# Patient Record
Sex: Female | Born: 1990 | Race: White | Hispanic: No | Marital: Single | Smoking: Never smoker
Health system: Southern US, Community
[De-identification: ages and names within clinical notes are randomized; demographics above are authoritative.]

## PROBLEM LIST (undated history)

## (undated) DIAGNOSIS — F909 Attention-deficit hyperactivity disorder, unspecified type: Secondary | ICD-10-CM

## (undated) HISTORY — DX: Attention-deficit hyperactivity disorder, unspecified type: F90.9

---

## 2008-08-23 ENCOUNTER — Ambulatory Visit: Payer: Self-pay | Admitting: Family Medicine

## 2008-08-23 DIAGNOSIS — R21 Rash and other nonspecific skin eruption: Secondary | ICD-10-CM | POA: Insufficient documentation

## 2008-08-30 ENCOUNTER — Encounter (INDEPENDENT_AMBULATORY_CARE_PROVIDER_SITE_OTHER): Payer: Self-pay | Admitting: *Deleted

## 2008-09-04 ENCOUNTER — Telehealth (INDEPENDENT_AMBULATORY_CARE_PROVIDER_SITE_OTHER): Payer: Self-pay | Admitting: *Deleted

## 2008-10-04 ENCOUNTER — Telehealth (INDEPENDENT_AMBULATORY_CARE_PROVIDER_SITE_OTHER): Payer: Self-pay | Admitting: *Deleted

## 2008-11-01 ENCOUNTER — Telehealth (INDEPENDENT_AMBULATORY_CARE_PROVIDER_SITE_OTHER): Payer: Self-pay | Admitting: *Deleted

## 2008-12-02 ENCOUNTER — Telehealth (INDEPENDENT_AMBULATORY_CARE_PROVIDER_SITE_OTHER): Payer: Self-pay | Admitting: *Deleted

## 2008-12-04 ENCOUNTER — Ambulatory Visit: Payer: Self-pay | Admitting: Family Medicine

## 2008-12-04 DIAGNOSIS — N390 Urinary tract infection, site not specified: Secondary | ICD-10-CM | POA: Insufficient documentation

## 2008-12-04 LAB — CONVERTED CEMR LAB
Ketones, urine, test strip: NEGATIVE
Nitrite: POSITIVE
Specific Gravity, Urine: 1.025
pH: 6

## 2008-12-05 ENCOUNTER — Encounter: Payer: Self-pay | Admitting: Family Medicine

## 2008-12-05 LAB — CONVERTED CEMR LAB

## 2008-12-10 ENCOUNTER — Encounter (INDEPENDENT_AMBULATORY_CARE_PROVIDER_SITE_OTHER): Payer: Self-pay | Admitting: *Deleted

## 2008-12-31 ENCOUNTER — Telehealth (INDEPENDENT_AMBULATORY_CARE_PROVIDER_SITE_OTHER): Payer: Self-pay | Admitting: *Deleted

## 2009-01-28 ENCOUNTER — Telehealth (INDEPENDENT_AMBULATORY_CARE_PROVIDER_SITE_OTHER): Payer: Self-pay | Admitting: *Deleted

## 2009-02-21 ENCOUNTER — Telehealth (INDEPENDENT_AMBULATORY_CARE_PROVIDER_SITE_OTHER): Payer: Self-pay | Admitting: *Deleted

## 2009-03-24 ENCOUNTER — Telehealth (INDEPENDENT_AMBULATORY_CARE_PROVIDER_SITE_OTHER): Payer: Self-pay | Admitting: *Deleted

## 2009-04-03 ENCOUNTER — Ambulatory Visit: Payer: Self-pay | Admitting: Family Medicine

## 2009-04-03 ENCOUNTER — Other Ambulatory Visit: Admission: RE | Admit: 2009-04-03 | Discharge: 2009-04-03 | Payer: Self-pay | Admitting: Family Medicine

## 2009-04-03 LAB — CONVERTED CEMR LAB
Glucose, Urine, Semiquant: NEGATIVE
Nitrite: NEGATIVE
Protein, U semiquant: NEGATIVE
Specific Gravity, Urine: >=1.03
Urobilinogen, UA: 0.2

## 2009-04-04 LAB — CONVERTED CEMR LAB
AST: 20 units/L (ref 0–37)
Albumin: 3.9 g/dL (ref 3.5–5.2)
Alkaline Phosphatase: 57 units/L (ref 39–117)
Basophils Absolute: 0 10*3/uL (ref 0.0–0.1)
Bilirubin, Direct: 0.1 mg/dL (ref 0.0–0.3)
CO2: 26 meq/L (ref 19–32)
Calcium: 9 mg/dL (ref 8.4–10.5)
Chloride: 106 meq/L (ref 96–112)
Creatinine, Ser: 0.8 mg/dL (ref 0.4–1.2)
Eosinophils Absolute: 0.1 10*3/uL (ref 0.0–0.7)
Glucose, Bld: 102 mg/dL — ABNORMAL HIGH (ref 70–99)
HDL: 49.6 mg/dL (ref >39.00)
Hemoglobin: 14.5 g/dL (ref 12.0–15.0)
Lymphocytes Relative: 21 % (ref 12.0–46.0)
Lymphs Abs: 1.8 10*3/uL (ref 0.7–4.0)
MCHC: 33.2 g/dL (ref 30.0–36.0)
Monocytes Relative: 4.4 % (ref 3.0–12.0)
Neutrophils Relative %: 72.7 % (ref 43.0–77.0)
Platelets: 240 10*3/uL (ref 150.0–400.0)
RBC: 5.05 M/uL (ref 3.87–5.11)
Sodium: 141 meq/L (ref 135–145)
TSH: 3.58 microintl units/mL (ref 0.35–5.50)
Total Protein: 7.8 g/dL (ref 6.0–8.3)
Triglycerides: 71 mg/dL (ref 0.0–149.0)
VLDL: 14.2 mg/dL (ref 0.0–40.0)

## 2009-04-21 ENCOUNTER — Telehealth (INDEPENDENT_AMBULATORY_CARE_PROVIDER_SITE_OTHER): Payer: Self-pay | Admitting: *Deleted

## 2009-05-19 ENCOUNTER — Telehealth (INDEPENDENT_AMBULATORY_CARE_PROVIDER_SITE_OTHER): Payer: Self-pay | Admitting: *Deleted

## 2009-06-09 ENCOUNTER — Emergency Department (HOSPITAL_BASED_OUTPATIENT_CLINIC_OR_DEPARTMENT_OTHER)
Admission: EM | Admit: 2009-06-09 | Discharge: 2009-06-09 | Payer: Self-pay | Source: Home / Self Care | Admitting: Emergency Medicine

## 2009-06-09 ENCOUNTER — Ambulatory Visit: Payer: Self-pay | Admitting: Diagnostic Radiology

## 2009-06-16 ENCOUNTER — Telehealth (INDEPENDENT_AMBULATORY_CARE_PROVIDER_SITE_OTHER): Payer: Self-pay | Admitting: *Deleted

## 2009-07-11 ENCOUNTER — Telehealth (INDEPENDENT_AMBULATORY_CARE_PROVIDER_SITE_OTHER): Payer: Self-pay | Admitting: *Deleted

## 2009-08-06 ENCOUNTER — Telehealth (INDEPENDENT_AMBULATORY_CARE_PROVIDER_SITE_OTHER): Payer: Self-pay | Admitting: *Deleted

## 2009-09-02 ENCOUNTER — Telehealth (INDEPENDENT_AMBULATORY_CARE_PROVIDER_SITE_OTHER): Payer: Self-pay | Admitting: *Deleted

## 2009-09-29 ENCOUNTER — Telehealth (INDEPENDENT_AMBULATORY_CARE_PROVIDER_SITE_OTHER): Payer: Self-pay | Admitting: *Deleted

## 2009-10-29 ENCOUNTER — Ambulatory Visit: Payer: Self-pay | Admitting: Family Medicine

## 2009-10-29 DIAGNOSIS — F988 Other specified behavioral and emotional disorders with onset usually occurring in childhood and adolescence: Secondary | ICD-10-CM | POA: Insufficient documentation

## 2009-11-25 ENCOUNTER — Telehealth (INDEPENDENT_AMBULATORY_CARE_PROVIDER_SITE_OTHER): Payer: Self-pay | Admitting: *Deleted

## 2009-12-22 ENCOUNTER — Telehealth (INDEPENDENT_AMBULATORY_CARE_PROVIDER_SITE_OTHER): Payer: Self-pay | Admitting: *Deleted

## 2010-01-16 ENCOUNTER — Telehealth: Payer: Self-pay | Admitting: Family Medicine

## 2010-02-16 ENCOUNTER — Telehealth (INDEPENDENT_AMBULATORY_CARE_PROVIDER_SITE_OTHER): Payer: Self-pay | Admitting: *Deleted

## 2010-03-16 ENCOUNTER — Telehealth (INDEPENDENT_AMBULATORY_CARE_PROVIDER_SITE_OTHER): Payer: Self-pay | Admitting: *Deleted

## 2010-04-13 ENCOUNTER — Telehealth (INDEPENDENT_AMBULATORY_CARE_PROVIDER_SITE_OTHER): Payer: Self-pay | Admitting: *Deleted

## 2010-05-11 ENCOUNTER — Telehealth (INDEPENDENT_AMBULATORY_CARE_PROVIDER_SITE_OTHER): Payer: Self-pay | Admitting: *Deleted

## 2010-05-26 NOTE — Progress Notes (Signed)
Summary: ritalin refill   Phone Note Refill Request Message from:  Pharmacy  Refills Requested: Medication #1:  RITALIN 10 MG TABS 1 by mouth three times a day patient will pick up tuesday - [id number]  Initial call taken by: Okey Regal Spring,  February 16, 2010 8:53 AM    Prescriptions: RITALIN 10 MG TABS (METHYLPHENIDATE HCL) 1 by mouth three times a day  #90 x 0   Entered by:   Doristine Devoid CMA   Authorized by:   Neena Rhymes MD   Signed by:   Doristine Devoid CMA on 02/16/2010   Method used:   Print then Give to Patient   RxID:   [id number]

## 2010-05-26 NOTE — Progress Notes (Signed)
Summary: ritalin refill   Phone Note Refill Request Call back at Work Phone [phone] Message from:  Patient on August 06, 2009 10:38 AM  Refills Requested: Medication #1:  RITALIN 10 MG TABS 1 by mouth three times a day  Method Requested: Pick up at Office Next Appointment Scheduled: no appt Initial call taken by: Barb Merino,  August 06, 2009 10:39 AM  Follow-up for Phone Call        left message on machine prescription ready for pick up.......Marland KitchenDoristine Devoid  August 06, 2009 2:29 PM     Prescriptions: RITALIN 10 MG TABS (METHYLPHENIDATE HCL) 1 by mouth three times a day  #90 x 0   Entered by:   Doristine Devoid   Authorized by:   Neena Rhymes MD   Signed by:   Doristine Devoid on 08/06/2009   Method used:   Print then Give to Patient   RxID:   [id number]

## 2010-05-26 NOTE — Progress Notes (Signed)
  Phone Note Refill Request Message from:  Patient  Refills Requested: Medication #1:  RITALIN 10 MG TABS 1 by mouth three times a day last ov cpx 04/03/09, last refill #90 x 0 on 04/21/09  Initial call taken by: Kandice Hams,  May 19, 2009 4:01 PM  Follow-up for Phone Call        pt picked up .Kandice Hams  May 19, 2009 4:04 PM  Follow-up by: Kandice Hams,  May 19, 2009 4:04 PM    Prescriptions: RITALIN 10 MG TABS (METHYLPHENIDATE HCL) 1 by mouth three times a day  #90 x 0   Entered by:   Kandice Hams   Authorized by:   Nolon Rod. Paz MD   Signed by:   Kandice Hams on 05/19/2009   Method used:   Print then Give to Patient   RxID:   [id number]

## 2010-05-26 NOTE — Progress Notes (Signed)
Summary: ritalin refill   Phone Note Refill Request Message from:  Patient  Refills Requested: Medication #1:  RITALIN 10 MG TABS 1 by mouth three times a day patient will pick up [id number]  Initial call taken by: Okey Regal Spring,  December 22, 2009 8:51 AM    Prescriptions: RITALIN 10 MG TABS (METHYLPHENIDATE HCL) 1 by mouth three times a day  #90 x 0   Entered by:   Doristine Devoid CMA   Authorized by:   Neena Rhymes MD   Signed by:   Doristine Devoid CMA on 12/22/2009   Method used:   Print then Give to Patient   RxID:   [id number]

## 2010-05-26 NOTE — Progress Notes (Signed)
Summary: ritalin rx  Phone Note Refill Request Call back at Work Phone [phone] Message from:  Patient  Refills Requested: Medication #1:  RITALIN 10 MG TABS 1 by mouth three times a day last ov 04/03/09, last refill #90 x 0 on 05/19/09  Initial call taken by: Kandice Hams,  June 16, 2009 9:28 AM  Follow-up for Phone Call        pt informed rx will be ready today and signed by Dr Drue Novel in the absence of Dr Beverely Low .Kandice Hams  June 16, 2009 9:33 AM  Follow-up by: Kandice Hams,  June 16, 2009 9:33 AM    Prescriptions: RITALIN 10 MG TABS (METHYLPHENIDATE HCL) 1 by mouth three times a day  #90 x 0   Entered by:   Kandice Hams   Authorized by:   Nolon Rod. Paz MD   Signed by:   Kandice Hams on 06/16/2009   Method used:   Print then Give to Patient   RxID:   [id number]

## 2010-05-26 NOTE — Progress Notes (Signed)
Summary: ritalin refill   Phone Note Refill Request Message from:  Patient on November 25, 2009 12:21 PM  Refills Requested: Medication #1:  RITALIN 10 MG TABS 1 by mouth three times a day   Dosage confirmed as above?Dosage Confirmed   Supply Requested: 1 month pt aware rx will be ready within the next 24 hrs.   Method Requested: Pick up at Office Next Appointment Scheduled: none Initial call taken by: Lavell Islam,  November 25, 2009 12:21 PM    Prescriptions: RITALIN 10 MG TABS (METHYLPHENIDATE HCL) 1 by mouth three times a day  #90 x 0   Entered by:   Doristine Devoid CMA   Authorized by:   Neena Rhymes MD   Signed by:   Doristine Devoid CMA on 11/26/2009   Method used:   Print then Give to Patient   RxID:   [id number]

## 2010-05-26 NOTE — Progress Notes (Signed)
Summary: ritalin refill   Phone Note Refill Request Call back at Work Phone [phone] Message from:  Patient on September 29, 2009 10:32 AM  Refills Requested: Medication #1:  RITALIN 10 MG TABS 1 by mouth three times a day call when ready   Method Requested: Pick up at Office Initial call taken by: Okey Regal Spring,  September 29, 2009 10:33 AM  Follow-up for Phone Call        needs office visit before next refill .......Marland KitchenDoristine Devoid  September 29, 2009 1:38 PM     Prescriptions: RITALIN 10 MG TABS (METHYLPHENIDATE HCL) 1 by mouth three times a day  #90 x 0   Entered by:   Doristine Devoid   Authorized by:   Neena Rhymes MD   Signed by:   Doristine Devoid on 09/29/2009   Method used:   Print then Give to Patient   RxID:   [id number]   Appended Document: ritalin refill  patient aware prescription ready for pick up

## 2010-05-26 NOTE — Progress Notes (Signed)
Summary: refill   Phone Note Refill Request Call back at Work Phone [phone] Message from:  Patient on Sep 02, 2009 8:28 AM  Refills Requested: Medication #1:  RITALIN 10 MG TABS 1 by mouth three times a day call when ready   Method Requested: Pick up at Office Next Appointment Scheduled: none Initial call taken by: Okey Regal Spring,  Sep 02, 2009 8:28 AM  Follow-up for Phone Call        pt picked up med yesterday per [facility] .Kandice Hams  Sep 03, 2009 11:00 AM  Follow-up by: Kandice Hams,  Sep 03, 2009 11:00 AM

## 2010-05-26 NOTE — Progress Notes (Signed)
Summary: ritalin rx  Phone Note Refill Request Call back at Work Phone [phone] Message from:  Patient on July 11, 2009 10:08 AM  Refills Requested: Medication #1:  RITALIN 10 MG TABS 1 by mouth three times a day call when ready   Initial call taken by: Doristine Devoid,  July 11, 2009 10:09 AM  Follow-up for Phone Call        last office visit 12/10 cpx last filled 06/16/09.Marland KitchenDoristine Devoid  July 11, 2009 10:10 AM   left msg prescription ready for pick up..........Marland KitchenDoristine Devoid  July 11, 2009 10:11 AM     Prescriptions: RITALIN 10 MG TABS (METHYLPHENIDATE HCL) 1 by mouth three times a day  #90 x 0   Entered by:   Doristine Devoid   Authorized by:   Loreen Freud DO   Signed by:   Doristine Devoid on 07/11/2009   Method used:   Print then Give to Patient   RxID:   [id number]

## 2010-05-26 NOTE — Assessment & Plan Note (Signed)
Summary: med check /cbs   Vital Signs:  Patient profile:   20 year old female Height:      66.50 inches Weight:      214 pounds BMI:     34.15 Pulse rate:   70 / minute BP sitting:   120 / 80  (left arm)  Vitals Entered By: Doristine Devoid (October 29, 2009 1:59 PM) CC: f/u up on ritalin   History of Present Illness: 20 yo girl here today for f/u on Ritalin.  current dose is working- 10mg  three times a day.  working at [employer] this summer, not taking summer classes.  no palpitations, anorexia, insomnia.  needs refill on OCP.  Current Medications (verified): 1)  Ritalin 10 Mg Tabs (Methylphenidate Hcl) .Marland Kitchen.. 1 By Mouth Three Times A Day 2)  Sprintec 28 0.25-35 Mg-Mcg Tabs (Norgestimate-Eth Estradiol) .Marland Kitchen.. 1 Pill Daily As Directed.  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 Lungs are clear to auscultation, no crackles or wheezes. Heart:  normal rate, regular rhythm, and no murmur.   Psych:  Cognition and judgment appear intact. Alert and cooperative with normal attention span and concentration. No apparent delusions, illusions, hallucinations   Impression & Recommendations:  Problem # 1:  ADD (ICD-314.00) Assessment New no side effects from meds.  sxs well controlled.  continue current dose.  Problem # 2:  CONTRACEPTIVE MANAGEMENT (ICD-V25.09) Assessment: Unchanged refills provided.  Complete Medication List: 1)  Ritalin 10 Mg Tabs (Methylphenidate hcl) .Marland Kitchen.. 1 by mouth three times a day 2)  Sprintec 28 0.25-35 Mg-mcg Tabs  (Norgestimate-eth estradiol) .Marland Kitchen.. 1 pill daily as directed.  Patient Instructions: 1)  Follow up this winter for your physical 2)  Call for your Ritalin monthly 3)  Call with any questions or concerns 4)  Have a great summer!! Prescriptions: RITALIN 10 MG TABS (METHYLPHENIDATE HCL) 1 by mouth three times a day  #90 x 0   Entered and Authorized by:   Neena Rhymes MD   Signed by:   Neena Rhymes MD on 10/29/2009   Method used:   Print then Give to Patient   RxID:   [id number] SPRINTEC 28 0.25-35 MG-MCG TABS (NORGESTIMATE-ETH ESTRADIOL) 1 pill daily as directed.  #1 x 11   Entered and Authorized by:   Neena Rhymes MD   Signed by:   Neena Rhymes MD on 10/29/2009   Method used:   Electronically to        [facility] [address] (retail)       [phone]       [place], [place]  [address]       Ph: [phone]       Fax: [phone]   RxID:   [id number]

## 2010-05-26 NOTE — Progress Notes (Signed)
Summary: Ritalin refill  Phone Note Refill Request Message from:  Patient on March 16, 2010 2:33 PM  Refills Requested: Medication #1:  RITALIN 10 MG TABS 1 by mouth three times a day Patient needs Ritalin prescription refilled.  Advised patient that it will be ready in 24 hours for pickup unless someone calls them  Initial call taken by: Jerolyn Shin,  March 16, 2010 2:33 PM    Prescriptions: RITALIN 10 MG TABS (METHYLPHENIDATE HCL) 1 by mouth three times a day  #90 x 0   Entered by:   Doristine Devoid CMA   Authorized by:   Neena Rhymes MD   Signed by:   Doristine Devoid CMA on 03/16/2010   Method used:   Print then Give to Patient   RxID:   [id number]

## 2010-05-26 NOTE — Progress Notes (Signed)
Summary: Refill--Ritalin  Phone Note Call from Patient Call back at [phone]   Caller: Patient Summary of Call: REFILL ON RITALIN 10 MG Initial call taken by: Freddy Jaksch,  January 16, 2010 12:56 PM  Follow-up for Phone Call        left message on voicemail to call back to office. Alisse Tuite CMA  January 19, 2010 9:42 AM   Spoke with patient and she will not have enough to last until MD returns, she is aware that a partner MD will sign and she can pick up prescription tomorrow. Jeani Fassnacht CMA  January 19, 2010 9:48 AM     Prescriptions: RITALIN 10 MG TABS (METHYLPHENIDATE HCL) 1 by mouth three times a day  #90 x 0   Entered by:   Lucious Groves CMA   Authorized by:   Loreen Freud DO   Signed by:   Lucious Groves CMA on 01/19/2010   Method used:   Print then Give to Patient   RxID:   [id number]

## 2010-05-28 NOTE — Progress Notes (Signed)
Summary: RX  Phone Note Refill Request Call back at [phone] Message from:  Patient on May 11, 2010 9:36 AM  Refills Requested: Medication #1:  RITALIN 10 MG TABS 1 by mouth three times a day   Dosage confirmed as above?Dosage Confirmed   Supply Requested: 1 month Initial call taken by: Freddy Jaksch,  May 11, 2010 9:36 AM  Follow-up for Phone Call        patient aware prescription sent to pharmacy .....Marland KitchenMarland KitchenDoristine Devoid CMA  May 11, 2010 2:21 PM     Prescriptions: RITALIN 10 MG TABS (METHYLPHENIDATE HCL) 1 by mouth three times a day  #90 x 0   Entered by:   Doristine Devoid CMA   Authorized by:   Neena Rhymes MD   Signed by:   Doristine Devoid CMA on 05/11/2010   Method used:   Print then Give to Patient   RxID:   [id number]

## 2010-05-28 NOTE — Progress Notes (Signed)
Summary: REFILL RITALIN 10MG   Phone Note Refill Request Call back at Work Phone [phone] Message from:  Patient on April 13, 2010 8:58 AM  Refills Requested: Medication #1:  RITALIN 10 MG TABS 1 by mouth three times a day   Dosage confirmed as above?Dosage Confirmed   Supply Requested: 1 month   Last Refilled: 03/16/2010  Method Requested: Pick up at Office Next Appointment Scheduled: NONE Initial call taken by: Magdalen Spatz [facility],  April 13, 2010 8:58 AM  Follow-up for Phone Call        left message on machine prescription ready for pick up........Marland KitchenDoristine Devoid CMA  April 13, 2010 11:24 AM     Prescriptions: RITALIN 10 MG TABS (METHYLPHENIDATE HCL) 1 by mouth three times a day  #90 x 0   Entered by:   Doristine Devoid CMA   Authorized by:   Neena Rhymes MD   Signed by:   Doristine Devoid CMA on 04/13/2010   Method used:   Print then Give to Patient   RxID:   [id number]

## 2010-06-05 ENCOUNTER — Telehealth (INDEPENDENT_AMBULATORY_CARE_PROVIDER_SITE_OTHER): Payer: Self-pay | Admitting: *Deleted

## 2010-06-11 NOTE — Progress Notes (Signed)
Summary: ritalin refill  Phone Note Refill Request Call back at Work Phone [phone] Message from:  Patient on June 05, 2010 9:09 AM  Refills Requested: Medication #1:  RITALIN 10 MG TABS 1 by mouth three times a day Patient needs Ritalin prescription refilled.  Advised patient that it will be ready in 24 hours for pickup unless someone calls them  Next Appointment Scheduled: none Initial call taken by: Jerolyn Shin,  June 05, 2010 9:09 AM    Prescriptions: RITALIN 10 MG TABS (METHYLPHENIDATE HCL) 1 by mouth three times a day  #90 x 0   Entered by:   Doristine Devoid CMA   Authorized by:   Neena Rhymes MD   Signed by:   Doristine Devoid CMA on 06/05/2010   Method used:   Print then Give to Patient   RxID:   [id number]

## 2010-07-01 ENCOUNTER — Telehealth: Payer: Self-pay | Admitting: Family Medicine

## 2010-07-07 NOTE — Progress Notes (Signed)
Summary: Refill--Ritalin  Phone Note Refill Request Message from:  Patient on July 01, 2010 8:30 AM  Refills Requested: Medication #1:  RITALIN 10 MG TABS 1 by mouth three times a day   Last Refilled: 06/05/2010 patient will pick up thursday [id number]  Next Appointment Scheduled: none Initial call taken by: Okey Regal Spring,  July 01, 2010 8:30 AM  Follow-up for Phone Call        Last office visit 7/11. Please advise. Richetta Cubillos CMA  July 01, 2010 8:44 AM   Additional Follow-up for Phone Call Additional follow up Details #1::        ok to refill- needs to schedule CPE Additional Follow-up by: Neena Rhymes MD,  July 01, 2010 9:41 AM    Additional Follow-up for Phone Call Additional follow up Details #2::    Wil make notation to schedule CPE when she picks up RX. Yareth Kearse CMA  July 01, 2010 9:47 AM   Prescriptions: RITALIN 10 MG TABS (METHYLPHENIDATE HCL) 1 by mouth three times a day  #90 x 0   Entered by:   Lucious Groves CMA   Authorized by:   Neena Rhymes MD   Signed by:   Lucious Groves CMA on 07/01/2010   Method used:   Print then Give to Patient   RxID:   [id number]

## 2010-07-29 ENCOUNTER — Telehealth: Payer: Self-pay | Admitting: Family Medicine

## 2010-07-29 MED ORDER — METHYLPHENIDATE HCL 10 MG PO TABS: 10.0000 mg | ORAL_TABLET | Freq: Three times a day (TID) | ORAL | Status: DC

## 2010-07-29 NOTE — Telephone Encounter (Signed)
Ok to refill but please let her know that this is the last refill until she has a physical scheduled.

## 2010-07-29 NOTE — Telephone Encounter (Signed)
Pt aware Rx ready for pick up 

## 2010-07-29 NOTE — Telephone Encounter (Signed)
Pt has not yet sch CPE as asked in March. Please advise.

## 2010-07-29 NOTE — Telephone Encounter (Signed)
Patient called to request Ritalin refill

## 2010-08-24 ENCOUNTER — Telehealth: Payer: Self-pay | Admitting: Family Medicine

## 2010-08-24 MED ORDER — METHYLPHENIDATE HCL 10 MG PO TABS: 10.0000 mg | ORAL_TABLET | Freq: Three times a day (TID) | ORAL | Status: DC

## 2010-08-24 NOTE — Telephone Encounter (Signed)
Ok to provide

## 2010-08-24 NOTE — Telephone Encounter (Signed)
Ok for refill since i'm the one changing the appt

## 2010-08-24 NOTE — Telephone Encounter (Signed)
Pt.notified

## 2010-08-24 NOTE — Telephone Encounter (Signed)
Patient was scheduled for cpx [id number] appt was rescheduled (md out of office) it was rescheduled for [id number] - she will be out of ritalin - patient wants refill until appt

## 2010-09-01 ENCOUNTER — Encounter: Payer: Self-pay | Admitting: Family Medicine

## 2010-09-07 ENCOUNTER — Encounter: Payer: Self-pay | Admitting: Family Medicine

## 2010-09-07 ENCOUNTER — Ambulatory Visit (INDEPENDENT_AMBULATORY_CARE_PROVIDER_SITE_OTHER): Payer: BC Managed Care – PPO | Admitting: Family Medicine

## 2010-09-07 DIAGNOSIS — Z0000 Encounter for general adult medical examination without abnormal findings: Secondary | ICD-10-CM

## 2010-09-07 DIAGNOSIS — F988 Other specified behavioral and emotional disorders with onset usually occurring in childhood and adolescence: Secondary | ICD-10-CM

## 2010-09-07 MED ORDER — METHYLPHENIDATE HCL ER (OSM) 27 MG PO TBCR: 27.0000 mg | EXTENDED_RELEASE_TABLET | Freq: Every day | ORAL | Status: DC

## 2010-09-07 NOTE — Patient Instructions (Signed)
Schedule your pap at your convenience- if you don't eat before this appt we can get your labs done Start the Concerta and let me know at your pap how it's working for you Call with any questions or concerns Enjoy Summer!!!

## 2010-09-07 NOTE — Progress Notes (Signed)
  Subjective:    Patient ID: Anabel Bene, female    DOB: [birth year], 20 y.o.   MRN: [id number]  HPI CPE- having period currently, would like to defer pap.  ADD- would like alternative to Ritalin b/c she's 'no longer feeling it'.  No side effects.  Has never been on long acting meds.   Review of Systems Patient reports no vision/ hearing changes, adenopathy,fever, weight change,  persistant/recurrent hoarseness , swallowing issues, chest pain, palpitations, edema, persistant/recurrent cough, hemoptysis, dyspnea (rest/exertional/paroxysmal nocturnal), gastrointestinal bleeding (melena, rectal bleeding), abdominal pain, significant heartburn, bowel changes, GU symptoms (dysuria, hematuria, incontinence), Gyn symptoms (abnormal  bleeding, pain),  syncope, focal weakness, memory loss, numbness & tingling, skin/hair/nail changes, abnormal bruising or bleeding, anxiety, or depression.     Objective:   Physical Exam  General Appearance:    Alert, cooperative, no distress, appears stated age, overwt  Head:    Normocephalic, without obvious abnormality, atraumatic  Eyes:    PERRL, conjunctiva/corneas clear, EOM's intact, fundi    benign, both eyes  Ears:    Normal TM's and external ear canals, both ears  Nose:   Nares normal, septum midline, mucosa normal, no drainage    or sinus tenderness  Throat:   Lips, mucosa, and tongue normal; teeth and gums normal  Neck:   Supple, symmetrical, trachea midline, no adenopathy;    Thyroid: no enlargement/tenderness/nodules  Back:     Symmetric, no curvature, ROM normal, no CVA tenderness  Lungs:     Clear to auscultation bilaterally, respirations unlabored  Chest Wall:    No tenderness or deformity   Heart:    Regular rate and rhythm, S1 and S2 normal, no murmur, rub   or gallop  Breast Exam:    No tenderness, masses, or nipple abnormality  Abdomen:     Soft, non-tender, bowel sounds active all four quadrants,    no masses, no organomegaly  Genitalia:     Deferred due to menses  Rectal:    Extremities:   Extremities normal, atraumatic, no cyanosis or edema  Pulses:   2+ and symmetric all extremities  Skin:   Skin color, texture, turgor normal, no rashes or lesions  Lymph nodes:   Cervical, supraclavicular, and axillary nodes normal  Neurologic:   CNII-XII intact, normal strength, sensation and reflexes    throughout          Assessment & Plan:

## 2010-09-14 ENCOUNTER — Encounter: Payer: Self-pay | Admitting: Family Medicine

## 2010-09-15 DIAGNOSIS — Z0000 Encounter for general adult medical examination without abnormal findings: Secondary | ICD-10-CM | POA: Insufficient documentation

## 2010-09-15 NOTE — Assessment & Plan Note (Signed)
Stop pt's short acting meds and start long acting concerta at slightly higher dose.  Reviewed supportive care and red flags that should prompt return.  Pt expressed understanding and is in agreement w/ plan.

## 2010-09-15 NOTE — Assessment & Plan Note (Signed)
Pt's PE WNL w/ exception of weight.  Stressed the importance of healthy food choices and regular exercise.  Pt aware and plans to make this a priority this summer.  Will follow.

## 2010-10-08 ENCOUNTER — Ambulatory Visit (INDEPENDENT_AMBULATORY_CARE_PROVIDER_SITE_OTHER): Payer: BC Managed Care – PPO | Admitting: Family Medicine

## 2010-10-08 ENCOUNTER — Other Ambulatory Visit (HOSPITAL_COMMUNITY)
Admission: RE | Admit: 2010-10-08 | Discharge: 2010-10-08 | Disposition: A | Discharge status: Home / Self Care | Payer: BC Managed Care – PPO | Source: Ambulatory Visit | Attending: Family Medicine | Admitting: Family Medicine

## 2010-10-08 DIAGNOSIS — J329 Chronic sinusitis, unspecified: Secondary | ICD-10-CM

## 2010-10-08 DIAGNOSIS — Z124 Encounter for screening for malignant neoplasm of cervix: Secondary | ICD-10-CM

## 2010-10-08 DIAGNOSIS — F988 Other specified behavioral and emotional disorders with onset usually occurring in childhood and adolescence: Secondary | ICD-10-CM

## 2010-10-08 DIAGNOSIS — Z01419 Encounter for gynecological examination (general) (routine) without abnormal findings: Secondary | ICD-10-CM | POA: Insufficient documentation

## 2010-10-08 DIAGNOSIS — Z0000 Encounter for general adult medical examination without abnormal findings: Secondary | ICD-10-CM

## 2010-10-08 MED ORDER — CLARITHROMYCIN ER 500 MG PO TB24: 1000.0000 mg | ORAL_TABLET | Freq: Every day | ORAL | Status: AC

## 2010-10-08 MED ORDER — NORGESTIMATE-ETH ESTRADIOL 0.25-35 MG-MCG PO TABS: 1.0000 | ORAL_TABLET | Freq: Every day | ORAL | Status: DC

## 2010-10-08 MED ORDER — METHYLPHENIDATE HCL 10 MG PO TABS: 10.0000 mg | ORAL_TABLET | Freq: Three times a day (TID) | ORAL | Status: DC

## 2010-10-08 NOTE — Progress Notes (Signed)
  Subjective:    Patient ID: Crystal Clements, female    DOB: August 18, 1990, 20 y.o.   MRN: [id number]  HPI Here today for pap.  No vaginal concerns.  ADD- doesn't like concerta.  Wants to switch back to Ritalin.  Doesn't like the idea of 1 pill lasting 24 hrs, 'i want to have more control'.  Didn't feel any effects from med.  Congestion- sxs started 2 days ago.  Has had nasal 'burning'.  No fevers.  + cough- 'it's in my chest'.  Has been taking mucinex and decongestant w/ some relief but cough is worsening.  + facial pain/pressure, tooth pain.    Review of Systems For ROS see HPI     Objective:   Physical Exam  Constitutional: She appears well-developed and well-nourished. No distress.  HENT:  Head: Normocephalic and atraumatic.  Right Ear: Tympanic membrane normal.  Left Ear: Tympanic membrane normal.  Nose: Mucosal edema and rhinorrhea present. Right sinus exhibits maxillary sinus tenderness and frontal sinus tenderness. Left sinus exhibits maxillary sinus tenderness and frontal sinus tenderness.  Mouth/Throat: Uvula is midline and mucous membranes are normal. Posterior oropharyngeal erythema present. No oropharyngeal exudate.  Eyes: Conjunctivae and EOM are normal. Pupils are equal, round, and reactive to light.  Neck: Normal range of motion. Neck supple.  Cardiovascular: Normal rate, regular rhythm and normal heart sounds.   Pulmonary/Chest: Effort normal and breath sounds normal. No respiratory distress. She has no wheezes.  Genitourinary: Uterus normal. Rectal exam shows no external hemorrhoid. There is no rash, tenderness or lesion on the right labia. There is no rash, tenderness or lesion on the left labia. Uterus is not deviated, not enlarged and not tender. Cervix exhibits no motion tenderness, no discharge and no friability. Right adnexum displays no mass, no tenderness and no fullness. Left adnexum displays no mass, no tenderness and no fullness. No tenderness or bleeding around  the vagina. No vaginal discharge found.  Lymphadenopathy:    She has no cervical adenopathy.          Assessment & Plan:

## 2010-10-08 NOTE — Patient Instructions (Signed)
You have a sinus infection Take the biaxin as directed- take w/ food to avoid upset stomach We'll notify you of your lab results and pap Continue your mucinex Drink plenty of fluids Call with any questions or concerns Hang in there!

## 2010-10-09 ENCOUNTER — Encounter: Payer: Self-pay | Admitting: *Deleted

## 2010-10-09 LAB — LIPID PANEL
Cholesterol: 188 mg/dL (ref 0–200)
LDL Cholesterol: 116 mg/dL — ABNORMAL HIGH (ref 0–99)

## 2010-10-09 LAB — CBC WITH DIFFERENTIAL/PLATELET
Basophils Absolute: 0 10*3/uL (ref 0.0–0.1)
HCT: 39.9 % (ref 36.0–46.0)
Lymphocytes Relative: 36.2 % (ref 12.0–46.0)
MCHC: 33.6 g/dL (ref 30.0–36.0)
Neutrophils Relative %: 51 % (ref 43.0–77.0)
Platelets: 179 10*3/uL (ref 150.0–400.0)
RDW: 14.4 % (ref 11.5–14.6)

## 2010-10-09 LAB — BASIC METABOLIC PANEL
CO2: 24 mEq/L (ref 19–32)
Chloride: 109 mEq/L (ref 96–112)
GFR: 123.75 mL/min (ref >60.00)
Glucose, Bld: 84 mg/dL (ref 70–99)
Potassium: 4.2 mEq/L (ref 3.5–5.1)
Sodium: 140 mEq/L (ref 135–145)

## 2010-10-09 LAB — HEPATIC FUNCTION PANEL
AST: 18 U/L (ref 0–37)
Albumin: 3.7 g/dL (ref 3.5–5.2)
Alkaline Phosphatase: 80 U/L (ref 39–117)
Total Protein: 7.1 g/dL (ref 6.0–8.3)

## 2010-10-09 LAB — TSH: TSH: 1.72 u[IU]/mL (ref 0.35–5.50)

## 2010-10-11 LAB — VITAMIN D 1,25 DIHYDROXY
Vitamin D2 1, 25 (OH)2: <8 pg/mL
Vitamin D3 1, 25 (OH)2: 59 pg/mL

## 2010-10-12 ENCOUNTER — Encounter: Payer: Self-pay | Admitting: *Deleted

## 2010-10-12 ENCOUNTER — Encounter: Payer: Self-pay | Admitting: Family Medicine

## 2010-10-12 DIAGNOSIS — J329 Chronic sinusitis, unspecified: Secondary | ICD-10-CM | POA: Insufficient documentation

## 2010-10-12 DIAGNOSIS — Z124 Encounter for screening for malignant neoplasm of cervix: Secondary | ICD-10-CM | POA: Insufficient documentation

## 2010-10-12 NOTE — Assessment & Plan Note (Signed)
Pt's hx and PE consistent w/ infxn.  Start abx.  Reviewed supportive care and red flags that should prompt return.  Pt expressed understanding and is in agreement w/ plan.  

## 2010-10-12 NOTE — Assessment & Plan Note (Signed)
Switch back to short acting meds so pt feels that she has more control over her tx.

## 2010-10-12 NOTE — Assessment & Plan Note (Signed)
Check labs 

## 2010-10-12 NOTE — Assessment & Plan Note (Signed)
Pap collected. 

## 2010-10-14 ENCOUNTER — Encounter: Payer: Self-pay | Admitting: *Deleted

## 2010-11-09 ENCOUNTER — Other Ambulatory Visit: Payer: Self-pay | Admitting: Family Medicine

## 2010-11-09 MED ORDER — METHYLPHENIDATE HCL 10 MG PO TABS: 10.0000 mg | ORAL_TABLET | Freq: Three times a day (TID) | ORAL | Status: DC

## 2010-11-09 NOTE — Telephone Encounter (Signed)
Refill ritalin - patient will pick up Tuesday [id number]

## 2010-11-09 NOTE — Telephone Encounter (Signed)
Printed, will have Lowne sign in MD absence.

## 2010-12-10 ENCOUNTER — Other Ambulatory Visit: Payer: Self-pay | Admitting: Family Medicine

## 2010-12-10 MED ORDER — METHYLPHENIDATE HCL 10 MG PO TABS: 10.0000 mg | ORAL_TABLET | Freq: Three times a day (TID) | ORAL | Status: DC

## 2010-12-10 NOTE — Telephone Encounter (Signed)
Ok for 1 month refill

## 2010-12-10 NOTE — Telephone Encounter (Signed)
Last filled 11/09/10

## 2010-12-10 NOTE — Telephone Encounter (Signed)
Rx ready for pt to pick up. Pt aware

## 2011-01-11 ENCOUNTER — Other Ambulatory Visit: Payer: Self-pay | Admitting: Family Medicine

## 2011-01-11 MED ORDER — METHYLPHENIDATE HCL 10 MG PO TABS: 10.0000 mg | ORAL_TABLET | Freq: Three times a day (TID) | ORAL | Status: DC

## 2011-01-11 NOTE — Telephone Encounter (Signed)
Ok for refill? 

## 2011-01-11 NOTE — Telephone Encounter (Signed)
Done

## 2011-01-11 NOTE — Telephone Encounter (Signed)
Last OV 10/08/10. Last filled 12/10/10

## 2011-02-08 ENCOUNTER — Other Ambulatory Visit: Payer: Self-pay | Admitting: Family Medicine

## 2011-02-08 MED ORDER — METHYLPHENIDATE HCL 10 MG PO TABS: 10.0000 mg | ORAL_TABLET | Freq: Three times a day (TID) | ORAL | Status: DC

## 2011-02-08 NOTE — Telephone Encounter (Signed)
Refill ritalin - patient will pick up [id number] tuesday

## 2011-03-05 ENCOUNTER — Telehealth: Payer: Self-pay | Admitting: Family Medicine

## 2011-03-05 NOTE — Telephone Encounter (Signed)
Left message to call office Immunization printed and ready for pick-up. Placed up front.

## 2011-03-11 ENCOUNTER — Telehealth: Payer: Self-pay | Admitting: Family Medicine

## 2011-03-11 NOTE — Telephone Encounter (Signed)
Refill ritalin - patient will pick up Monday [id number]

## 2011-03-12 MED ORDER — METHYLPHENIDATE HCL 10 MG PO TABS: 10.0000 mg | ORAL_TABLET | Freq: Three times a day (TID) | ORAL | Status: DC

## 2011-03-12 NOTE — Telephone Encounter (Signed)
Placed ritalin rx for pt at front desk. .left message to have patient return my call. With mom.

## 2011-03-12 NOTE — Telephone Encounter (Signed)
Ok for refill? 

## 2011-03-12 NOTE — Telephone Encounter (Signed)
Last OV 10-08-10 last refill 02-03-11

## 2011-03-15 ENCOUNTER — Other Ambulatory Visit: Payer: Self-pay | Admitting: *Deleted

## 2011-03-15 NOTE — Telephone Encounter (Signed)
Spoke to pt to advise rx is up front desk for pick up.pt understood

## 2011-03-24 ENCOUNTER — Ambulatory Visit (INDEPENDENT_AMBULATORY_CARE_PROVIDER_SITE_OTHER): Payer: BC Managed Care – PPO | Admitting: *Deleted

## 2011-03-24 DIAGNOSIS — Z111 Encounter for screening for respiratory tuberculosis: Secondary | ICD-10-CM

## 2011-03-24 DIAGNOSIS — Z0000 Encounter for general adult medical examination without abnormal findings: Secondary | ICD-10-CM

## 2011-03-25 LAB — VARICELLA ZOSTER ANTIBODY, IGG: Varicella IgG: 4.16 {ISR} — ABNORMAL HIGH

## 2011-03-26 ENCOUNTER — Encounter: Payer: Self-pay | Admitting: *Deleted

## 2011-03-26 LAB — TB SKIN TEST
Induration: 0
TB Skin Test: NEGATIVE mm

## 2011-04-13 ENCOUNTER — Telehealth: Payer: Self-pay | Admitting: Family Medicine

## 2011-04-13 NOTE — Telephone Encounter (Signed)
Last OV 10-08-10 last refill 03-12-11 #90 no refills

## 2011-04-13 NOTE — Telephone Encounter (Signed)
Refill ritalin - dad will pick up [id number]

## 2011-04-14 MED ORDER — METHYLPHENIDATE HCL 10 MG PO TABS: 10.0000 mg | ORAL_TABLET | Freq: Three times a day (TID) | ORAL | Status: DC

## 2011-04-14 NOTE — Telephone Encounter (Signed)
Rx placed up front for pick up, pt aware

## 2011-04-14 NOTE — Telephone Encounter (Signed)
Ok for #90, no refills 

## 2011-05-13 ENCOUNTER — Telehealth: Payer: Self-pay | Admitting: Family Medicine

## 2011-05-13 MED ORDER — METHYLPHENIDATE HCL 10 MG PO TABS: 10.0000 mg | ORAL_TABLET | Freq: Three times a day (TID) | ORAL | Status: DC

## 2011-05-13 NOTE — Telephone Encounter (Signed)
Called pt to advise the medication rx is at front desk for pick up

## 2011-05-13 NOTE — Telephone Encounter (Signed)
Patient is requesting a new prescription for ritalin.

## 2011-05-13 NOTE — Telephone Encounter (Signed)
Ok for refill? 

## 2011-05-13 NOTE — Telephone Encounter (Signed)
Last OV 10-08-11 last refill 04-14-11 #90 no refills per TID

## 2011-06-11 ENCOUNTER — Other Ambulatory Visit: Payer: Self-pay | Admitting: *Deleted

## 2011-06-11 NOTE — Telephone Encounter (Signed)
Pt called in to ask for rx Ritalin, noted last OV 10-08-10 last refill 05-13-11 #90 no refills

## 2011-06-13 NOTE — Telephone Encounter (Signed)
Ok for #90, no refill 

## 2011-06-14 MED ORDER — METHYLPHENIDATE HCL 10 MG PO TABS: 10.0000 mg | ORAL_TABLET | Freq: Three times a day (TID) | ORAL | Status: DC

## 2011-06-14 NOTE — Telephone Encounter (Signed)
Placed signed rx for ritalin at front desk for pt pick up, pt aware

## 2011-06-14 NOTE — Telephone Encounter (Signed)
Addended by: Derry Lory A on: 06/14/2011 08:20 AM   Modules accepted: Orders

## 2011-07-09 ENCOUNTER — Other Ambulatory Visit: Payer: Self-pay | Admitting: *Deleted

## 2011-07-09 MED ORDER — METHYLPHENIDATE HCL 10 MG PO TABS: 10.0000 mg | ORAL_TABLET | Freq: Three times a day (TID) | ORAL | Status: DC

## 2011-07-09 NOTE — Telephone Encounter (Signed)
Pt called to request rx for ritalin, noted pt last seen for GME on 03-24-11, MD Beverely Low verified the approval verbally to refill medication per last refill noted 05-13-11 for #90 no refill, pt is aware that the signed rx has been placed at the front desk for pick up.

## 2011-08-09 ENCOUNTER — Other Ambulatory Visit: Payer: Self-pay | Admitting: *Deleted

## 2011-08-09 MED ORDER — METHYLPHENIDATE HCL 10 MG PO TABS: 10.0000 mg | ORAL_TABLET | Freq: Three times a day (TID) | ORAL | Status: DC

## 2011-08-09 NOTE — Telephone Encounter (Signed)
Pt aware Rx ready for pick up, and 6 month f/u due.

## 2011-09-28 ENCOUNTER — Telehealth: Payer: Self-pay | Admitting: *Deleted

## 2011-09-28 MED ORDER — METHYLPHENIDATE HCL 10 MG PO TABS: 10.0000 mg | ORAL_TABLET | Freq: Three times a day (TID) | ORAL | Status: DC

## 2011-09-28 NOTE — Telephone Encounter (Signed)
Pt left vm stating she needs refill on her ritalin, called pt and she verified that she is doing well on the ritalin and has no side effects due to the medication, denies weight loss, denies heart palpitations, MD Tabori gave verbal order to give medication refills to next CPE. MD Beverely Low signed RX for Ritalin TID 10mg , #90 no refills , pt aware RX is ready for pick up

## 2011-10-26 ENCOUNTER — Other Ambulatory Visit: Payer: Self-pay | Admitting: *Deleted

## 2011-10-26 MED ORDER — METHYLPHENIDATE HCL 10 MG PO TABS: 10.0000 mg | ORAL_TABLET | Freq: Three times a day (TID) | ORAL | Status: DC

## 2011-10-26 NOTE — Telephone Encounter (Signed)
Ok for #90 but needs OV since it has been a year

## 2011-10-26 NOTE — Telephone Encounter (Signed)
Placed signed RX up front for pt pick up, advised pt family member at home number that pt needs to call office to schedule CPE, he stated he would let her know her RX is ready for pick up and she needs to schedule a CPE, placed note on front of RX stating pt needs to schedule CPE as well

## 2011-10-26 NOTE — Telephone Encounter (Signed)
Last OV 10-08-10, last filled 09-28-11 #90, no pending OV

## 2011-11-25 ENCOUNTER — Telehealth: Payer: Self-pay | Admitting: Family Medicine

## 2011-11-25 MED ORDER — METHYLPHENIDATE HCL 10 MG PO TABS: 10.0000 mg | ORAL_TABLET | Freq: Three times a day (TID) | ORAL | Status: DC

## 2011-11-25 NOTE — Telephone Encounter (Signed)
Ok to give 1 month 

## 2011-11-25 NOTE — Telephone Encounter (Signed)
Placed refill at front desk for pick up, called pt to advise it is ready for pick up

## 2011-11-25 NOTE — Telephone Encounter (Signed)
Pt called requesting rx for Ritalin 10mg . Call mobile # when ready.

## 2011-11-25 NOTE — Telephone Encounter (Signed)
Last OV 10-08-10, last refill 10-26-11 #90 no refills TID, please note the following:  Upcoming OV 01-10-12  Phone notation from 09-28-11 Pt left vm stating she needs refill on her ritalin, called pt and she verified that she is doing well on the ritalin and has no side effects due to the medication, denies weight loss, denies heart palpitations, MD Tabori gave verbal order to give medication refills to next CPE. MD Beverely Low signed RX for Ritalin TID 10mg , #90 no refills , pt aware RX is ready for pick up

## 2011-12-24 ENCOUNTER — Other Ambulatory Visit: Payer: Self-pay | Admitting: *Deleted

## 2011-12-24 MED ORDER — METHYLPHENIDATE HCL 10 MG PO TABS: 10.0000 mg | ORAL_TABLET | Freq: Three times a day (TID) | ORAL | Status: DC

## 2011-12-24 NOTE — Telephone Encounter (Signed)
Left Pt detail message, Rx ready for pick up. 

## 2012-01-10 ENCOUNTER — Ambulatory Visit (INDEPENDENT_AMBULATORY_CARE_PROVIDER_SITE_OTHER): Payer: BC Managed Care – PPO | Admitting: Family Medicine

## 2012-01-10 ENCOUNTER — Other Ambulatory Visit (HOSPITAL_COMMUNITY)
Admission: RE | Admit: 2012-01-10 | Discharge: 2012-01-10 | Disposition: A | Discharge status: Home / Self Care | Payer: BC Managed Care – PPO | Source: Ambulatory Visit | Attending: Family Medicine | Admitting: Family Medicine

## 2012-01-10 ENCOUNTER — Encounter: Payer: Self-pay | Admitting: Family Medicine

## 2012-01-10 VITALS — BP 123/78 | HR 100 | Temp 98.4°F | Ht 67.0 in | Wt 246.2 lb

## 2012-01-10 DIAGNOSIS — Z01419 Encounter for gynecological examination (general) (routine) without abnormal findings: Secondary | ICD-10-CM | POA: Insufficient documentation

## 2012-01-10 DIAGNOSIS — Z124 Encounter for screening for malignant neoplasm of cervix: Secondary | ICD-10-CM

## 2012-01-10 DIAGNOSIS — Z23 Encounter for immunization: Secondary | ICD-10-CM

## 2012-01-10 DIAGNOSIS — Z113 Encounter for screening for infections with a predominantly sexual mode of transmission: Secondary | ICD-10-CM | POA: Insufficient documentation

## 2012-01-10 DIAGNOSIS — Z0000 Encounter for general adult medical examination without abnormal findings: Secondary | ICD-10-CM

## 2012-01-10 LAB — CBC WITH DIFFERENTIAL/PLATELET
Basophils Absolute: 0 10*3/uL (ref 0.0–0.1)
Eosinophils Relative: 1.4 % (ref 0.0–5.0)
HCT: 42.1 % (ref 36.0–46.0)
Hemoglobin: 13.5 g/dL (ref 12.0–15.0)
Lymphs Abs: 2.5 10*3/uL (ref 0.7–4.0)
MCV: 86.6 fl (ref 78.0–100.0)
Monocytes Absolute: 0.3 10*3/uL (ref 0.1–1.0)
Monocytes Relative: 3.7 % (ref 3.0–12.0)
Neutro Abs: 6.4 10*3/uL (ref 1.4–7.7)
WBC: 9.4 10*3/uL (ref 4.5–10.5)

## 2012-01-10 LAB — HEPATIC FUNCTION PANEL
AST: 20 U/L (ref 0–37)
Alkaline Phosphatase: 90 U/L (ref 39–117)
Total Bilirubin: 0.5 mg/dL (ref 0.3–1.2)

## 2012-01-10 LAB — BASIC METABOLIC PANEL
Calcium: 8.8 mg/dL (ref 8.4–10.5)
Creatinine, Ser: 0.8 mg/dL (ref 0.4–1.2)
GFR: 94.57 mL/min (ref >60.00)
Sodium: 141 mEq/L (ref 135–145)

## 2012-01-10 LAB — LIPID PANEL
Cholesterol: 168 mg/dL (ref 0–200)
HDL: 40.9 mg/dL (ref >39.00)
LDL Cholesterol: 107 mg/dL — ABNORMAL HIGH (ref 0–99)
VLDL: 19.8 mg/dL (ref 0.0–40.0)

## 2012-01-10 LAB — TSH: TSH: 1.55 u[IU]/mL (ref 0.35–5.50)

## 2012-01-10 MED ORDER — LEVONORGESTREL-ETHINYL ESTRAD 0.1-20 MG-MCG PO TABS: 1.0000 | ORAL_TABLET | Freq: Every day | ORAL | Status: DC

## 2012-01-10 NOTE — Assessment & Plan Note (Signed)
Pap collected. 

## 2012-01-10 NOTE — Addendum Note (Signed)
Addended by: Derry Lory A on: 01/10/2012 09:35 AM   Modules accepted: Orders

## 2012-01-10 NOTE — Progress Notes (Signed)
  Subjective:    Patient ID: Crystal Clements, female    DOB: [birth year], 21 y.o.   MRN: [id number]  HPI CPE- no concerns today.   Review of Systems Patient reports no vision/ hearing changes, adenopathy,fever, weight change,  persistant/recurrent hoarseness , swallowing issues, chest pain, palpitations, edema, persistant/recurrent cough, hemoptysis, dyspnea (rest/exertional/paroxysmal nocturnal), gastrointestinal bleeding (melena, rectal bleeding), abdominal pain, significant heartburn, bowel changes, GU symptoms (dysuria, hematuria, incontinence), Gyn symptoms (abnormal  bleeding, pain),  syncope, focal weakness, memory loss, numbness & tingling, skin/hair/nail changes, abnormal bruising or bleeding, anxiety, or depression.     Objective:   Physical Exam  General Appearance:    Alert, cooperative, no distress, appears stated age  Head:    Normocephalic, without obvious abnormality, atraumatic  Eyes:    PERRL, conjunctiva/corneas clear, EOM's intact, fundi    benign, both eyes  Ears:    Normal TM's and external ear canals, both ears  Nose:   Nares normal, septum midline, mucosa normal, no drainage    or sinus tenderness  Throat:   Lips, mucosa, and tongue normal; teeth and gums normal  Neck:   Supple, symmetrical, trachea midline, no adenopathy;    Thyroid: no enlargement/tenderness/nodules  Back:     Symmetric, no curvature, ROM normal, no CVA tenderness  Lungs:     Clear to auscultation bilaterally, respirations unlabored  Chest Wall:    No tenderness or deformity   Heart:    Regular rate and rhythm, S1 and S2 normal, no murmur, rub   or gallop  Breast Exam:    No tenderness, masses, or nipple abnormality  Abdomen:     Soft, non-tender, bowel sounds active all four quadrants,    no masses, no organomegaly  Genitalia:    External genitalia normal, cervix normal in appearance, no CMT, uterus in normal size and position, adnexa w/out mass or tenderness, mucosa pink and moist, no lesions  or discharge present  Rectal:    Normal external appearance  Extremities:   Extremities normal, atraumatic, no cyanosis or edema  Pulses:   2+ and symmetric all extremities  Skin:   Skin color, texture, turgor normal, no rashes or lesions  Lymph nodes:   Cervical, supraclavicular, and axillary nodes normal  Neurologic:   CNII-XII intact, normal strength, sensation and reflexes    throughout          Assessment & Plan:

## 2012-01-10 NOTE — Patient Instructions (Addendum)
Follow up in 1 year or as needed We'll notify you of your lab results Continue to make healthy food choices and get regular exercise Start the pills the Sunday after you next bleed Call with any questions or concerns Happy Early Birthday!

## 2012-01-10 NOTE — Assessment & Plan Note (Signed)
Pt's PE WNL.  Flu shot given.  Check labs.  Birth control switched to low dose pill b/c of increased irritability.  Anticipatory guidance provided.

## 2012-01-12 ENCOUNTER — Encounter: Payer: Self-pay | Admitting: *Deleted

## 2012-01-24 ENCOUNTER — Other Ambulatory Visit: Payer: Self-pay

## 2012-01-24 MED ORDER — METHYLPHENIDATE HCL 10 MG PO TABS: 10.0000 mg | ORAL_TABLET | Freq: Three times a day (TID) | ORAL | Status: DC

## 2012-01-24 NOTE — Telephone Encounter (Signed)
Need okay.   MW  

## 2012-01-24 NOTE — Telephone Encounter (Signed)
Left message stating pt Rx ready for pick up.    MW

## 2012-02-21 ENCOUNTER — Other Ambulatory Visit: Payer: Self-pay

## 2012-02-21 MED ORDER — METHYLPHENIDATE HCL 10 MG PO TABS: 10.0000 mg | ORAL_TABLET | Freq: Three times a day (TID) | ORAL | Status: DC

## 2012-02-21 NOTE — Telephone Encounter (Signed)
Rx placed up front for pick up. Pt notified.  

## 2012-02-21 NOTE — Telephone Encounter (Signed)
OV 01/10/12, Last filled 01/24/12 #90 no refills   Plz advise     MW

## 2012-03-09 ENCOUNTER — Encounter: Payer: Self-pay | Admitting: Family Medicine

## 2012-03-21 ENCOUNTER — Other Ambulatory Visit: Payer: Self-pay

## 2012-03-21 NOTE — Telephone Encounter (Signed)
OV 01/10/12 last filled 02/21/12 # 90 no refills  Plz advise    MW

## 2012-03-22 MED ORDER — METHYLPHENIDATE HCL 10 MG PO TABS: 10.0000 mg | ORAL_TABLET | Freq: Three times a day (TID) | ORAL | Status: DC

## 2012-03-24 NOTE — Telephone Encounter (Signed)
Called advised pt dad Rx ready for pick up.   MW

## 2012-04-18 ENCOUNTER — Other Ambulatory Visit: Payer: Self-pay | Admitting: *Deleted

## 2012-04-18 ENCOUNTER — Encounter: Payer: Self-pay | Admitting: *Deleted

## 2012-04-18 MED ORDER — METHYLPHENIDATE HCL 10 MG PO TABS: 10.0000 mg | ORAL_TABLET | Freq: Three times a day (TID) | ORAL | Status: DC

## 2012-04-18 NOTE — Telephone Encounter (Signed)
Pt aware Rx ready for pickup and to sign agreement. 

## 2012-05-22 ENCOUNTER — Telehealth: Payer: Self-pay | Admitting: *Deleted

## 2012-05-22 NOTE — Telephone Encounter (Signed)
Patient called triage, is requesting refill on Ritalin. Last filled 04/18/12 #90 no rf, last ov 01/10/12 cpe. Okay to refill, please advise. Agreement on file

## 2012-05-23 MED ORDER — METHYLPHENIDATE HCL 10 MG PO TABS: 10.0000 mg | ORAL_TABLET | Freq: Three times a day (TID) | ORAL | Status: DC

## 2012-05-23 NOTE — Telephone Encounter (Signed)
LMOM and also spoke with the pt's mother and informed her that the rx is ready and will leave if up front to be picked up.//AB/CMA

## 2012-05-23 NOTE — Telephone Encounter (Signed)
Ok for #90, no refills 

## 2012-06-22 ENCOUNTER — Other Ambulatory Visit: Payer: Self-pay | Admitting: *Deleted

## 2012-06-22 MED ORDER — METHYLPHENIDATE HCL 10 MG PO TABS: 10.0000 mg | ORAL_TABLET | Freq: Three times a day (TID) | ORAL | Status: DC

## 2012-06-22 NOTE — Telephone Encounter (Signed)
Rx ready for pick up., Pt aware. 

## 2012-07-20 ENCOUNTER — Telehealth: Payer: Self-pay | Admitting: *Deleted

## 2012-07-20 NOTE — Telephone Encounter (Signed)
Ok for #90, no refills 

## 2012-07-20 NOTE — Telephone Encounter (Signed)
Patient called triage requesting refill on ritalin. Last OV 01/10/12 with last refill on 06/22/12 #90. Okay to refill?

## 2012-07-21 MED ORDER — METHYLPHENIDATE HCL 10 MG PO TABS: 10.0000 mg | ORAL_TABLET | Freq: Three times a day (TID) | ORAL | Status: DC

## 2012-07-21 NOTE — Telephone Encounter (Signed)
Rx printed and informed the pt that she will be able to p/u rx on mid afternoon on Monday.  Pt understood and agreed.//AB/CMA

## 2012-08-21 ENCOUNTER — Telehealth: Payer: Self-pay | Admitting: General Practice

## 2012-08-21 MED ORDER — METHYLPHENIDATE HCL 10 MG PO TABS: 10.0000 mg | ORAL_TABLET | Freq: Three times a day (TID) | ORAL | Status: DC

## 2012-08-21 NOTE — Telephone Encounter (Signed)
Pt notified Rx available at the front desk.

## 2012-08-21 NOTE — Telephone Encounter (Signed)
Pt requesting a Ritalin refill.Pt last seen on 01/10/12 and med last filled on 07/21/12 #90 with 0 refills. Ok to fill?

## 2012-08-21 NOTE — Telephone Encounter (Signed)
Ok for 90

## 2012-08-21 NOTE — Telephone Encounter (Signed)
Med filled per Tabori.  

## 2012-09-04 ENCOUNTER — Ambulatory Visit (INDEPENDENT_AMBULATORY_CARE_PROVIDER_SITE_OTHER): Payer: BC Managed Care – PPO | Admitting: Family Medicine

## 2012-09-04 ENCOUNTER — Encounter: Payer: Self-pay | Admitting: Family Medicine

## 2012-09-04 VITALS — BP 122/72 | HR 116 | Temp 99.8°F | Wt 253.0 lb

## 2012-09-04 DIAGNOSIS — J029 Acute pharyngitis, unspecified: Secondary | ICD-10-CM

## 2012-09-04 DIAGNOSIS — J321 Chronic frontal sinusitis: Secondary | ICD-10-CM

## 2012-09-04 MED ORDER — AMOXICILLIN-POT CLAVULANATE 875-125 MG PO TABS: 1.0000 | ORAL_TABLET | Freq: Two times a day (BID) | ORAL | Status: DC

## 2012-09-04 NOTE — Patient Instructions (Signed)

## 2012-09-04 NOTE — Progress Notes (Signed)
  Subjective:     Crystal Clements is a 22 y.o. female who presents for evaluation of sinus pain. Symptoms include: congestion, facial pain, headaches, nasal congestion, purulent rhinorrhea and sinus pressure. Onset of symptoms was 1 week ago. Symptoms have been gradually worsening since that time. Past history is significant for no history of pneumonia or bronchitis. Patient is a non-smoker.  The following portions of the patient's history were reviewed and updated as appropriate: allergies, current medications, past family history, past medical history, past social history, past surgical history and problem list.  Review of Systems Pertinent items are noted in HPI.   Objective:    BP 122/72  Pulse 116  Temp(Src) 99.8 F (37.7 C) (Oral)  Wt 253 lb (114.76 kg)  BMI 39.62 kg/m2  SpO2 96% General appearance: alert, cooperative, appears stated age and no distress Head: Normocephalic, without obvious abnormality, atraumatic Ears: normal TM's and external ear canals both ears Nose: green discharge, moderate congestion, turbinates red, swollen Throat: abnormal findings: marked oropharyngeal erythema and pnd Neck: moderate anterior cervical adenopathy, supple, symmetrical, trachea midline and thyroid not enlarged, symmetric, no tenderness/mass/nodules Lungs: clear to auscultation bilaterally Heart: S1, S2 normal    Assessment:    Acute bacterial sinusitis.    Plan:    Nasal steroids per medication orders. Antihistamines per medication orders. Augmentin per medication orders. f/u prn

## 2012-09-19 ENCOUNTER — Telehealth: Payer: Self-pay | Admitting: General Practice

## 2012-09-19 NOTE — Telephone Encounter (Signed)
Ritalin Refill Request.  Last OV 09-04-12 Med last filled on 08-21-12 #90 with 0 refills

## 2012-09-20 MED ORDER — METHYLPHENIDATE HCL 10 MG PO TABS: 10.0000 mg | ORAL_TABLET | Freq: Three times a day (TID) | ORAL | Status: DC

## 2012-09-20 NOTE — Telephone Encounter (Signed)
Ok for #90, no refills 

## 2012-09-20 NOTE — Telephone Encounter (Signed)
Spoke with the pt's mother and informed her to let the pt know that the med refill request has been approved and she can pick it up tomorrow after lunch.  The mother said it will be picked up by her or the pt.//AB/CMA

## 2012-10-19 ENCOUNTER — Telehealth: Payer: Self-pay | Admitting: *Deleted

## 2012-10-19 DIAGNOSIS — F988 Other specified behavioral and emotional disorders with onset usually occurring in childhood and adolescence: Secondary | ICD-10-CM

## 2012-10-19 MED ORDER — METHYLPHENIDATE HCL 10 MG PO TABS: 10.0000 mg | ORAL_TABLET | Freq: Three times a day (TID) | ORAL | Status: DC

## 2012-10-19 NOTE — Telephone Encounter (Signed)
Refill for Ritalin done and pt notified via vm on cell.

## 2012-10-19 NOTE — Telephone Encounter (Signed)
Ok for refill? 

## 2012-10-19 NOTE — Telephone Encounter (Signed)
Patient left message on triage line requesting refill on Ritalin. Last office visit was on 09/04/12 with last refill on 09/20/12 #90. Okay to refill?

## 2012-11-20 ENCOUNTER — Telehealth: Payer: Self-pay | Admitting: *Deleted

## 2012-11-20 DIAGNOSIS — F988 Other specified behavioral and emotional disorders with onset usually occurring in childhood and adolescence: Secondary | ICD-10-CM

## 2012-11-20 MED ORDER — METHYLPHENIDATE HCL 10 MG PO TABS: 10.0000 mg | ORAL_TABLET | Freq: Three times a day (TID) | ORAL | Status: DC

## 2012-11-20 NOTE — Telephone Encounter (Signed)
Patient left message on triage line requesting refill on Ritalin. Last OV 09/04/12 and date of last refill 10/19/12 for #90 with no RF. Okay to refill?

## 2012-11-20 NOTE — Telephone Encounter (Signed)
Ok to refill but thought this script was signed last week.Marland KitchenMarland Kitchen

## 2012-11-20 NOTE — Telephone Encounter (Signed)
Refill for Ritalin done, pt notified. Mom to come by office to pick up script later today.

## 2012-12-18 ENCOUNTER — Telehealth: Payer: Self-pay | Admitting: General Practice

## 2012-12-18 DIAGNOSIS — F988 Other specified behavioral and emotional disorders with onset usually occurring in childhood and adolescence: Secondary | ICD-10-CM

## 2012-12-18 NOTE — Telephone Encounter (Signed)
Message left on Triage line stating that she needs a refill on her methylphenidate (RITALIN) 10 MG tablet Last filled #90 with 0 refills on 7/28 [facility] for refill?

## 2012-12-19 MED ORDER — METHYLPHENIDATE HCL 10 MG PO TABS: 10.0000 mg | ORAL_TABLET | Freq: Three times a day (TID) | ORAL | Status: DC

## 2012-12-19 NOTE — Telephone Encounter (Signed)
Ok for 90

## 2012-12-19 NOTE — Telephone Encounter (Signed)
Med filled. Pt notified.  

## 2013-01-17 ENCOUNTER — Other Ambulatory Visit: Payer: Self-pay | Admitting: General Practice

## 2013-01-17 DIAGNOSIS — F988 Other specified behavioral and emotional disorders with onset usually occurring in childhood and adolescence: Secondary | ICD-10-CM

## 2013-01-17 MED ORDER — METHYLPHENIDATE HCL 10 MG PO TABS: 10.0000 mg | ORAL_TABLET | Freq: Three times a day (TID) | ORAL | Status: DC

## 2013-01-17 NOTE — Telephone Encounter (Signed)
Ok for refill- needs UDS

## 2013-01-17 NOTE — Telephone Encounter (Signed)
methylphenidate (RITALIN) 10 MG tablet Last OV 09-04-12 (lowne) Med filled 8-26 #90 with 0 refills.   Contract on file, no record of UDS

## 2013-01-17 NOTE — Telephone Encounter (Signed)
Med filled. Pt notified.  

## 2013-02-15 ENCOUNTER — Telehealth: Payer: Self-pay | Admitting: *Deleted

## 2013-02-15 DIAGNOSIS — F988 Other specified behavioral and emotional disorders with onset usually occurring in childhood and adolescence: Secondary | ICD-10-CM

## 2013-02-15 NOTE — Telephone Encounter (Signed)
Last seen  09/04/2012  Last filled 01/17/2013  No UDS on file, contract on file   Please advise. SW, CMA

## 2013-02-16 MED ORDER — METHYLPHENIDATE HCL 10 MG PO TABS: 10.0000 mg | ORAL_TABLET | Freq: Three times a day (TID) | ORAL | Status: DC

## 2013-02-16 NOTE — Telephone Encounter (Signed)
Refill x1---  Ov due next month

## 2013-02-16 NOTE — Telephone Encounter (Signed)
Patient notified and made aware of scheduling an OV

## 2013-03-06 ENCOUNTER — Encounter: Payer: Self-pay | Admitting: Family Medicine

## 2013-03-15 ENCOUNTER — Telehealth: Payer: Self-pay | Admitting: *Deleted

## 2013-03-15 DIAGNOSIS — F988 Other specified behavioral and emotional disorders with onset usually occurring in childhood and adolescence: Secondary | ICD-10-CM

## 2013-03-15 MED ORDER — METHYLPHENIDATE HCL 10 MG PO TABS: 10.0000 mg | ORAL_TABLET | Freq: Three times a day (TID) | ORAL | Status: DC

## 2013-03-15 NOTE — Telephone Encounter (Signed)
Ok for refill? 

## 2013-03-15 NOTE — Telephone Encounter (Signed)
Med filled.  

## 2013-03-15 NOTE — Telephone Encounter (Signed)
Patient is requesting refill for Ritalin  Last seen-09/04/2012  Last filled-02/16/2013  UDS-01/19/2013 low risk, contract signed  Please advise. SW

## 2013-04-13 ENCOUNTER — Telehealth: Payer: Self-pay | Admitting: *Deleted

## 2013-04-13 DIAGNOSIS — F988 Other specified behavioral and emotional disorders with onset usually occurring in childhood and adolescence: Secondary | ICD-10-CM

## 2013-04-13 MED ORDER — METHYLPHENIDATE HCL 10 MG PO TABS: 10.0000 mg | ORAL_TABLET | Freq: Three times a day (TID) | ORAL | Status: DC

## 2013-04-13 NOTE — Telephone Encounter (Signed)
Ok for 90

## 2013-04-13 NOTE — Telephone Encounter (Signed)
Patient is requesting refill on Ritalin Last OV 09/04/12 Last filled 03/15/13 #90 Agreement on file UDS low risk Okay to refill?

## 2013-04-13 NOTE — Telephone Encounter (Signed)
Med filled and pt notified.  

## 2013-05-11 ENCOUNTER — Other Ambulatory Visit: Payer: Self-pay | Admitting: *Deleted

## 2013-05-11 DIAGNOSIS — F988 Other specified behavioral and emotional disorders with onset usually occurring in childhood and adolescence: Secondary | ICD-10-CM

## 2013-05-11 MED ORDER — METHYLPHENIDATE HCL 10 MG PO TABS: 10.0000 mg | ORAL_TABLET | Freq: Three times a day (TID) | ORAL | Status: DC

## 2013-05-11 NOTE — Telephone Encounter (Signed)
Last seen-09/04/2012  Last filled-04/13/2013  UDS-01/19/2013 low risk, contract signed  Please advise. SW

## 2013-05-11 NOTE — Telephone Encounter (Signed)
Patient notified and placed up front.

## 2013-05-22 ENCOUNTER — Telehealth: Payer: Self-pay

## 2013-05-22 NOTE — Telephone Encounter (Signed)
Left message for call back. Non-identifiable  Pap-last Pap on 01/10/12-negative Flu-01/24/13 Tdap-

## 2013-05-24 ENCOUNTER — Encounter: Payer: Self-pay | Admitting: Family Medicine

## 2013-05-24 ENCOUNTER — Ambulatory Visit (INDEPENDENT_AMBULATORY_CARE_PROVIDER_SITE_OTHER): Payer: BC Managed Care – PPO | Admitting: Family Medicine

## 2013-05-24 VITALS — BP 122/64 | Temp 98.8°F | Resp 16 | Ht 67.0 in | Wt 261.0 lb

## 2013-05-24 DIAGNOSIS — Z23 Encounter for immunization: Secondary | ICD-10-CM

## 2013-05-24 DIAGNOSIS — Z0000 Encounter for general adult medical examination without abnormal findings: Secondary | ICD-10-CM

## 2013-05-24 LAB — LIPID PANEL
Cholesterol: 171 mg/dL (ref 0–200)
HDL: 40.1 mg/dL (ref >39.00)
LDL Cholesterol: 108 mg/dL — ABNORMAL HIGH (ref 0–99)
Total CHOL/HDL Ratio: 4
Triglycerides: 115 mg/dL (ref 0.0–149.0)
VLDL: 23 mg/dL (ref 0.0–40.0)

## 2013-05-24 LAB — CBC WITH DIFFERENTIAL/PLATELET
BASOS PCT: 0.5 % (ref 0.0–3.0)
Basophils Absolute: 0 10*3/uL (ref 0.0–0.1)
EOS ABS: 0.1 10*3/uL (ref 0.0–0.7)
Eosinophils Relative: 1.3 % (ref 0.0–5.0)
HCT: 42.6 % (ref 36.0–46.0)
HEMOGLOBIN: 13.6 g/dL (ref 12.0–15.0)
Lymphocytes Relative: 26.5 % (ref 12.0–46.0)
Lymphs Abs: 2.4 10*3/uL (ref 0.7–4.0)
MCHC: 31.9 g/dL (ref 30.0–36.0)
MCV: 86.4 fl (ref 78.0–100.0)
MONO ABS: 0.3 10*3/uL (ref 0.1–1.0)
Monocytes Relative: 3.6 % (ref 3.0–12.0)
NEUTROS ABS: 6.2 10*3/uL (ref 1.4–7.7)
Neutrophils Relative %: 68.1 % (ref 43.0–77.0)
Platelets: 291 10*3/uL (ref 150.0–400.0)
RBC: 4.93 Mil/uL (ref 3.87–5.11)
RDW: 13.5 % (ref 11.5–14.6)
WBC: 9.1 10*3/uL (ref 4.5–10.5)

## 2013-05-24 LAB — HEPATIC FUNCTION PANEL
ALBUMIN: 3.8 g/dL (ref 3.5–5.2)
ALT: 14 U/L (ref 0–35)
AST: 18 U/L (ref 0–37)
Alkaline Phosphatase: 86 U/L (ref 39–117)
BILIRUBIN TOTAL: 0.8 mg/dL (ref 0.3–1.2)
Bilirubin, Direct: 0 mg/dL (ref 0.0–0.3)
Total Protein: 7.4 g/dL (ref 6.0–8.3)

## 2013-05-24 LAB — BASIC METABOLIC PANEL
BUN: 10 mg/dL (ref 6–23)
CHLORIDE: 105 meq/L (ref 96–112)
CO2: 26 mEq/L (ref 19–32)
CREATININE: 0.8 mg/dL (ref 0.4–1.2)
Calcium: 8.7 mg/dL (ref 8.4–10.5)
GFR: 88.34 mL/min (ref >60.00)
Glucose, Bld: 103 mg/dL — ABNORMAL HIGH (ref 70–99)
Potassium: 3.6 mEq/L (ref 3.5–5.1)
Sodium: 139 mEq/L (ref 135–145)

## 2013-05-24 LAB — TSH: TSH: 1.92 u[IU]/mL (ref 0.35–5.50)

## 2013-05-24 NOTE — Assessment & Plan Note (Signed)
Pt's PE WNL w/ exception of obesity.  Check labs.  Anticipatory guidance provided.  

## 2013-05-24 NOTE — Progress Notes (Signed)
   Subjective:    Patient ID: Crystal Clements, female    DOB: [birth year], 23 y.o.   MRN: [id number]  HPI CPE- UTD on pap, due for Tdap.  No concerns.   Review of Systems Patient reports no vision/ hearing changes, adenopathy,fever, weight change,  persistant/recurrent hoarseness , swallowing issues, chest pain, palpitations, edema, persistant/recurrent cough, hemoptysis, dyspnea (rest/exertional/paroxysmal nocturnal), gastrointestinal bleeding (melena, rectal bleeding), abdominal pain, significant heartburn, bowel changes, GU symptoms (dysuria, hematuria, incontinence), Gyn symptoms (abnormal  bleeding, pain),  syncope, focal weakness, memory loss, numbness & tingling, skin/hair/nail changes, abnormal bruising or bleeding, anxiety, or depression.     Objective:   Physical Exam  General Appearance:    Alert, cooperative, no distress, appears stated age  Head:    Normocephalic, without obvious abnormality, atraumatic  Eyes:    PERRL, conjunctiva/corneas clear, EOM's intact, fundi    benign, both eyes  Ears:    Normal TM's and external ear canals, both ears  Nose:   Nares normal, septum midline, mucosa normal, no drainage    or sinus tenderness  Throat:   Lips, mucosa, and tongue normal; teeth and gums normal  Neck:   Supple, symmetrical, trachea midline, no adenopathy;    Thyroid: no enlargement/tenderness/nodules  Back:     Symmetric, no curvature, ROM normal, no CVA tenderness  Lungs:     Clear to auscultation bilaterally, respirations unlabored  Chest Wall:    No tenderness or deformity   Heart:    Regular rate and rhythm, S1 and S2 normal, no murmur, rub   or gallop  Breast Exam:    No tenderness, masses, or nipple abnormality  Abdomen:     Soft, non-tender, bowel sounds active all four quadrants,    no masses, no organomegaly  Genitalia:    Deferred, UTD  Rectal:    Extremities:   Extremities normal, atraumatic, no cyanosis or edema  Pulses:   2+ and symmetric all extremities    Skin:   Skin color, texture, turgor normal, no rashes or lesions  Lymph nodes:   Cervical, supraclavicular, and axillary nodes normal  Neurologic:   CNII-XII intact, normal strength, sensation and reflexes    throughout          Assessment & Plan:

## 2013-05-24 NOTE — Patient Instructions (Signed)
Follow up in 1 year or as needed Keep up the good work!  You look great! We'll notify you of your lab results and make any changes if needed Call with any questions or concerns Happy New Year!!! 

## 2013-05-25 NOTE — Telephone Encounter (Signed)
Unable to reach pre visit.  

## 2013-05-29 LAB — VITAMIN D 1,25 DIHYDROXY
VITAMIN D3 1, 25 (OH): 37 pg/mL
Vitamin D 1, 25 (OH)2 Total: 37 pg/mL (ref 18–72)
Vitamin D2 1, 25 (OH)2: <8 pg/mL

## 2013-06-11 ENCOUNTER — Telehealth: Payer: Self-pay | Admitting: General Practice

## 2013-06-11 ENCOUNTER — Other Ambulatory Visit: Payer: Self-pay | Admitting: *Deleted

## 2013-06-11 DIAGNOSIS — F988 Other specified behavioral and emotional disorders with onset usually occurring in childhood and adolescence: Secondary | ICD-10-CM

## 2013-06-11 MED ORDER — METHYLPHENIDATE HCL 10 MG PO TABS: 10.0000 mg | ORAL_TABLET | Freq: Three times a day (TID) | ORAL | Status: DC

## 2013-06-11 MED ORDER — METHYLPHENIDATE HCL 10 MG PO TABS
10.0000 mg | ORAL_TABLET | Freq: Three times a day (TID) | ORAL | Status: DC
Start: 2013-06-11 — End: 2013-09-05

## 2013-06-11 NOTE — Telephone Encounter (Signed)
She can have 3 rx if she can keep track of them and not loose them--- she should be seen in 6 months for f/u (July).

## 2013-06-11 NOTE — Telephone Encounter (Signed)
Ritalin med refill Last filled 05/11/13 #90 with 0 Last ov 05/24/13

## 2013-06-11 NOTE — Telephone Encounter (Signed)
Scripts x3 for Ritalin printed and placed up front, pt aware.

## 2013-06-11 NOTE — Telephone Encounter (Signed)
Dr.Lowne this is not your patient. Did you still want to give her 3 months worth of medication?        KP

## 2013-06-11 NOTE — Telephone Encounter (Signed)
Refill x1--- can give 3 rx if pt would like

## 2013-09-05 ENCOUNTER — Telehealth: Payer: Self-pay | Admitting: Family Medicine

## 2013-09-05 DIAGNOSIS — F988 Other specified behavioral and emotional disorders with onset usually occurring in childhood and adolescence: Secondary | ICD-10-CM

## 2013-09-05 MED ORDER — METHYLPHENIDATE HCL 10 MG PO TABS: 10.0000 mg | ORAL_TABLET | Freq: Three times a day (TID) | ORAL | Status: DC

## 2013-09-05 NOTE — Telephone Encounter (Signed)
Last OV 05-24-13 Med filled 06-11-13 #90 with 0  Low risk, due for UDS

## 2013-09-05 NOTE — Telephone Encounter (Signed)
Caller name: Anabel BeneKelly Causer Relation to pt: patient Call back number: [phone] Pharmacy:  Reason for call: to request a refill for Ritalin

## 2013-09-05 NOTE — Telephone Encounter (Signed)
Ok for #90, due for UDS

## 2013-09-05 NOTE — Telephone Encounter (Signed)
Med filled and pt notified.  

## 2013-09-27 ENCOUNTER — Encounter: Payer: Self-pay | Admitting: Family Medicine

## 2013-10-03 ENCOUNTER — Telehealth: Payer: Self-pay | Admitting: *Deleted

## 2013-10-03 DIAGNOSIS — F988 Other specified behavioral and emotional disorders with onset usually occurring in childhood and adolescence: Secondary | ICD-10-CM

## 2013-10-03 MED ORDER — METHYLPHENIDATE HCL 10 MG PO TABS
10.0000 mg | ORAL_TABLET | Freq: Three times a day (TID) | ORAL | Status: DC
Start: 2013-10-03 — End: 2013-11-02

## 2013-10-03 NOTE — Telephone Encounter (Signed)
Med filled.  

## 2013-10-03 NOTE — Telephone Encounter (Signed)
Caller name:  Fynley Relation to pt:  self Call back number:  [phone] Pharmacy:  [facility] on [facility]  Reason for call:   Pt called requesting refill on:  methylphenidate (RITALIN) 10 MG tablet  Last filled 09/05/2013, #90, no refills Last OV 05/24/2013  Please advise.  bw

## 2013-10-15 ENCOUNTER — Telehealth: Payer: Self-pay | Admitting: Family Medicine

## 2013-10-16 ENCOUNTER — Ambulatory Visit (INDEPENDENT_AMBULATORY_CARE_PROVIDER_SITE_OTHER): Payer: BC Managed Care – PPO

## 2013-10-16 DIAGNOSIS — Z111 Encounter for screening for respiratory tuberculosis: Secondary | ICD-10-CM

## 2013-10-18 ENCOUNTER — Encounter: Payer: Self-pay | Admitting: *Deleted

## 2013-10-18 LAB — TB SKIN TEST: TB Skin Test: NEGATIVE

## 2013-11-02 ENCOUNTER — Telehealth: Payer: Self-pay | Admitting: Family Medicine

## 2013-11-02 DIAGNOSIS — F988 Other specified behavioral and emotional disorders with onset usually occurring in childhood and adolescence: Secondary | ICD-10-CM

## 2013-11-02 MED ORDER — METHYLPHENIDATE HCL 10 MG PO TABS
10.0000 mg | ORAL_TABLET | Freq: Three times a day (TID) | ORAL | Status: DC
Start: 2013-11-02 — End: 2013-12-04

## 2013-11-02 NOTE — Telephone Encounter (Signed)
Last OV 05-24-13 Med filled 10-03-13 #90 with 0  Med filled per protocol.

## 2013-11-02 NOTE — Telephone Encounter (Signed)
Caller name: Tresa EndoKelly Relation to pt: patient Call back number: [phone] Pharmacy:  Reason for call: patient called to request a refill for methylphenidate (RITALIN) 10 MG tablet

## 2013-11-12 ENCOUNTER — Encounter: Payer: Self-pay | Admitting: Family Medicine

## 2013-12-03 ENCOUNTER — Telehealth: Payer: Self-pay

## 2013-12-03 DIAGNOSIS — F988 Other specified behavioral and emotional disorders with onset usually occurring in childhood and adolescence: Secondary | ICD-10-CM

## 2013-12-03 NOTE — Telephone Encounter (Signed)
Called to say she is out of her methylphenidate (RITALIN) 10 MG tablet Could you call her when the prescription is ready to pick up.

## 2013-12-04 MED ORDER — METHYLPHENIDATE HCL 10 MG PO TABS: 10.0000 mg | ORAL_TABLET | Freq: Three times a day (TID) | ORAL | Status: DC

## 2013-12-04 NOTE — Telephone Encounter (Signed)
Med filled.  

## 2013-12-04 NOTE — Telephone Encounter (Signed)
Last OV 05-24-13 Med filled 11-02-13 #90 with 0

## 2013-12-04 NOTE — Telephone Encounter (Signed)
Ok for #90, no refills 

## 2014-01-02 ENCOUNTER — Telehealth: Payer: Self-pay | Admitting: Family Medicine

## 2014-01-02 DIAGNOSIS — F988 Other specified behavioral and emotional disorders with onset usually occurring in childhood and adolescence: Secondary | ICD-10-CM

## 2014-01-02 MED ORDER — METHYLPHENIDATE HCL 10 MG PO TABS: 10.0000 mg | ORAL_TABLET | Freq: Three times a day (TID) | ORAL | Status: DC

## 2014-01-02 NOTE — Telephone Encounter (Signed)
Ok for #90, no refills 

## 2014-01-02 NOTE — Telephone Encounter (Signed)
Last ov 05-24-13 Ritalin filled 12-04-13 #90 with 0

## 2014-01-02 NOTE — Telephone Encounter (Signed)
Requesting refill on ritalin °

## 2014-01-02 NOTE — Telephone Encounter (Signed)
Med filled and pt notified.  

## 2014-01-31 ENCOUNTER — Telehealth: Payer: Self-pay | Admitting: Family Medicine

## 2014-01-31 DIAGNOSIS — F988 Other specified behavioral and emotional disorders with onset usually occurring in childhood and adolescence: Secondary | ICD-10-CM

## 2014-01-31 MED ORDER — METHYLPHENIDATE HCL 10 MG PO TABS: 10.0000 mg | ORAL_TABLET | Freq: Three times a day (TID) | ORAL | Status: DC

## 2014-01-31 NOTE — Telephone Encounter (Signed)
Last OV 05-24-13 Med filled 01-02-14 #90 with 0

## 2014-01-31 NOTE — Telephone Encounter (Signed)
Caller name: Tresa EndoKelly  Relation to pt: self  Call back number: [phone]   Reason for call: pt requesting a refill of methylphenidate (RITALIN) 10 MG tablet

## 2014-01-31 NOTE — Telephone Encounter (Signed)
Ok for #90, please remind pt to schedule CPE 

## 2014-01-31 NOTE — Telephone Encounter (Signed)
Med filled and pt notified.  

## 2014-03-01 ENCOUNTER — Telehealth: Payer: Self-pay

## 2014-03-01 DIAGNOSIS — F988 Other specified behavioral and emotional disorders with onset usually occurring in childhood and adolescence: Secondary | ICD-10-CM

## 2014-03-01 MED ORDER — METHYLPHENIDATE HCL 10 MG PO TABS: 10.0000 mg | ORAL_TABLET | Freq: Three times a day (TID) | ORAL | Status: DC

## 2014-03-01 NOTE — Telephone Encounter (Signed)
Crystal Clements [phone] [phone] [facility]   Crystal Clements called to say she needs a refill on her methylphenidate (RITALIN) 10 MG tablet, call when ready

## 2014-03-01 NOTE — Telephone Encounter (Signed)
Med filled per verbal from provider.  

## 2014-04-02 ENCOUNTER — Telehealth: Payer: Self-pay | Admitting: Family Medicine

## 2014-04-02 DIAGNOSIS — F988 Other specified behavioral and emotional disorders with onset usually occurring in childhood and adolescence: Secondary | ICD-10-CM

## 2014-04-02 MED ORDER — METHYLPHENIDATE HCL 10 MG PO TABS: 10.0000 mg | ORAL_TABLET | Freq: Three times a day (TID) | ORAL | Status: DC

## 2014-04-02 NOTE — Telephone Encounter (Signed)
Med filled.  

## 2014-04-02 NOTE — Telephone Encounter (Signed)
Last OV 05-24-13 Ritalin last filled 03-01-14 #90 with 0

## 2014-04-02 NOTE — Telephone Encounter (Signed)
Pt  requesting a refill methylphenidate (RITALIN) 10 MG tablet

## 2014-04-02 NOTE — Telephone Encounter (Signed)
Ok for 90

## 2014-05-03 ENCOUNTER — Telehealth: Payer: Self-pay | Admitting: Family Medicine

## 2014-05-03 DIAGNOSIS — F988 Other specified behavioral and emotional disorders with onset usually occurring in childhood and adolescence: Secondary | ICD-10-CM

## 2014-05-03 MED ORDER — METHYLPHENIDATE HCL 10 MG PO TABS: 10.0000 mg | ORAL_TABLET | Freq: Three times a day (TID) | ORAL | Status: DC

## 2014-05-03 NOTE — Telephone Encounter (Signed)
Caller name: [name]  Relation to pt: self  Call back number: [phone] Pharmacy:  Reason for call:   requesting ritalin refill

## 2014-05-03 NOTE — Telephone Encounter (Signed)
Last OV 05-24-13 Ritalin last filled 04-02-14 #90 with 0

## 2014-05-03 NOTE — Telephone Encounter (Signed)
Ok for refill but pt is overdue on CPE.  Needs to schedule in order to continue to get meds

## 2014-05-03 NOTE — Telephone Encounter (Signed)
Med filled and pt notified.  

## 2014-05-31 ENCOUNTER — Telehealth: Payer: Self-pay | Admitting: Family Medicine

## 2014-05-31 DIAGNOSIS — F988 Other specified behavioral and emotional disorders with onset usually occurring in childhood and adolescence: Secondary | ICD-10-CM

## 2014-05-31 MED ORDER — METHYLPHENIDATE HCL 10 MG PO TABS: 10.0000 mg | ORAL_TABLET | Freq: Three times a day (TID) | ORAL | Status: DC

## 2014-05-31 NOTE — Telephone Encounter (Signed)
Caller name: Tresa EndoKelly Relation to pt: self Call back number: [phone] Pharmacy:  Reason for call:   Requesting a new ritalin rx. Patient scheduled cpe for 06/04/14

## 2014-05-31 NOTE — Telephone Encounter (Signed)
Med filled, pt notified.  

## 2014-06-04 ENCOUNTER — Other Ambulatory Visit (HOSPITAL_COMMUNITY)
Admission: RE | Admit: 2014-06-04 | Discharge: 2014-06-04 | Disposition: A | Discharge status: Home / Self Care | Payer: BLUE CROSS/BLUE SHIELD | Source: Ambulatory Visit | Attending: Family Medicine | Admitting: Family Medicine

## 2014-06-04 ENCOUNTER — Ambulatory Visit (INDEPENDENT_AMBULATORY_CARE_PROVIDER_SITE_OTHER): Payer: BLUE CROSS/BLUE SHIELD | Admitting: Family Medicine

## 2014-06-04 ENCOUNTER — Encounter: Payer: Self-pay | Admitting: Family Medicine

## 2014-06-04 VITALS — BP 120/70 | HR 95 | Temp 98.6°F | Resp 16 | Ht 67.5 in | Wt 275.2 lb

## 2014-06-04 DIAGNOSIS — Z1151 Encounter for screening for human papillomavirus (HPV): Secondary | ICD-10-CM | POA: Insufficient documentation

## 2014-06-04 DIAGNOSIS — Z0000 Encounter for general adult medical examination without abnormal findings: Secondary | ICD-10-CM

## 2014-06-04 DIAGNOSIS — Z124 Encounter for screening for malignant neoplasm of cervix: Secondary | ICD-10-CM

## 2014-06-04 DIAGNOSIS — Z01419 Encounter for gynecological examination (general) (routine) without abnormal findings: Secondary | ICD-10-CM | POA: Insufficient documentation

## 2014-06-04 LAB — CBC WITH DIFFERENTIAL/PLATELET
Basophils Absolute: 0.1 10*3/uL (ref 0.0–0.1)
Basophils Relative: 0.6 % (ref 0.0–3.0)
EOS PCT: 1.4 % (ref 0.0–5.0)
Eosinophils Absolute: 0.1 10*3/uL (ref 0.0–0.7)
HEMATOCRIT: 41.9 % (ref 36.0–46.0)
Hemoglobin: 14 g/dL (ref 12.0–15.0)
LYMPHS ABS: 2.6 10*3/uL (ref 0.7–4.0)
LYMPHS PCT: 25.2 % (ref 12.0–46.0)
MCHC: 33.5 g/dL (ref 30.0–36.0)
MCV: 82.9 fl (ref 78.0–100.0)
MONOS PCT: 4.2 % (ref 3.0–12.0)
Monocytes Absolute: 0.4 10*3/uL (ref 0.1–1.0)
Neutro Abs: 7.1 10*3/uL (ref 1.4–7.7)
Neutrophils Relative %: 68.6 % (ref 43.0–77.0)
Platelets: 282 10*3/uL (ref 150.0–400.0)
RBC: 5.06 Mil/uL (ref 3.87–5.11)
RDW: 14.7 % (ref 11.5–15.5)
WBC: 10.3 10*3/uL (ref 4.0–10.5)

## 2014-06-04 LAB — LIPID PANEL
Cholesterol: 184 mg/dL (ref 0–200)
HDL: 47.9 mg/dL (ref >39.00)
LDL Cholesterol: 119 mg/dL — ABNORMAL HIGH (ref 0–99)
NonHDL: 136.1
TRIGLYCERIDES: 84 mg/dL (ref 0.0–149.0)
Total CHOL/HDL Ratio: 4
VLDL: 16.8 mg/dL (ref 0.0–40.0)

## 2014-06-04 LAB — BASIC METABOLIC PANEL
BUN: 13 mg/dL (ref 6–23)
CALCIUM: 9.3 mg/dL (ref 8.4–10.5)
CO2: 26 mEq/L (ref 19–32)
CREATININE: 0.76 mg/dL (ref 0.40–1.20)
Chloride: 106 mEq/L (ref 96–112)
GFR: 98.33 mL/min (ref >60.00)
GLUCOSE: 96 mg/dL (ref 70–99)
Potassium: 4.2 mEq/L (ref 3.5–5.1)
SODIUM: 139 meq/L (ref 135–145)

## 2014-06-04 LAB — HEPATIC FUNCTION PANEL
ALT: 18 U/L (ref 0–35)
AST: 21 U/L (ref 0–37)
Albumin: 4.1 g/dL (ref 3.5–5.2)
Alkaline Phosphatase: 91 U/L (ref 39–117)
BILIRUBIN TOTAL: 0.6 mg/dL (ref 0.2–1.2)
Bilirubin, Direct: 0.1 mg/dL (ref 0.0–0.3)
Total Protein: 7.3 g/dL (ref 6.0–8.3)

## 2014-06-04 LAB — TSH: TSH: 2.67 u[IU]/mL (ref 0.35–4.50)

## 2014-06-04 LAB — VITAMIN D 25 HYDROXY (VIT D DEFICIENCY, FRACTURES): VITD: 18.22 ng/mL — AB (ref 30.00–100.00)

## 2014-06-04 NOTE — Assessment & Plan Note (Signed)
Pap collected. 

## 2014-06-04 NOTE — Patient Instructions (Signed)
Follow up in 1 year or as needed We'll notify you of your lab results and make any changes if needed Try and make healthy food choices and get regular exercise Call with any questions or concerns Happy Valentine's Day!!!

## 2014-06-04 NOTE — Assessment & Plan Note (Signed)
Pt's PE WNL w/ exception of obesity.  Stressed need for healthy diet and regular exercise.  Check labs.  Anticipatory guidance provided.  

## 2014-06-04 NOTE — Progress Notes (Signed)
   Subjective:    Patient ID: Crystal Clements, female    DOB: January 15, 1991, 24 y.o.   MRN: [id number]  HPI CPE- no concerns today.   Review of Systems Patient reports no vision/ hearing changes, adenopathy,fever, weight change,  persistant/recurrent hoarseness , swallowing issues, chest pain, palpitations, edema, persistant/recurrent cough, hemoptysis, dyspnea (rest/exertional/paroxysmal nocturnal), gastrointestinal bleeding (melena, rectal bleeding), abdominal pain, significant heartburn, bowel changes, GU symptoms (dysuria, hematuria, incontinence), Gyn symptoms (abnormal  bleeding, pain),  syncope, focal weakness, memory loss, numbness & tingling, skin/hair/nail changes, abnormal bruising or bleeding, anxiety, or depression.   Reviewed meds, allergies, problem list, and PMH in chart     Objective:   Physical Exam  General Appearance:    Alert, cooperative, no distress, appears stated age, obese  Head:    Normocephalic, without obvious abnormality, atraumatic  Eyes:    PERRL, conjunctiva/corneas clear, EOM's intact, fundi    benign, both eyes  Ears:    Normal TM's and external ear canals, both ears  Nose:   Nares normal, septum midline, mucosa normal, no drainage    or sinus tenderness  Throat:   Lips, mucosa, and tongue normal; teeth and gums normal  Neck:   Supple, symmetrical, trachea midline, no adenopathy;    Thyroid: no enlargement/tenderness/nodules  Back:     Symmetric, no curvature, ROM normal, no CVA tenderness  Lungs:     Clear to auscultation bilaterally, respirations unlabored  Chest Wall:    No tenderness or deformity   Heart:    Regular rate and rhythm, S1 and S2 normal, no murmur, rub   or gallop  Breast Exam:    No tenderness, masses, or nipple abnormality  Abdomen:     Soft, non-tender, bowel sounds active all four quadrants,    no masses, no organomegaly  Genitalia:    External genitalia normal, cervix normal in appearance, no CMT, uterus in normal size and  position, adnexa w/out mass or tenderness, mucosa pink and moist, no lesions or discharge present  Rectal:    Normal external appearance  Extremities:   Extremities normal, atraumatic, no cyanosis or edema  Pulses:   2+ and symmetric all extremities  Skin:   Skin color, texture, turgor normal, no rashes or lesions  Lymph nodes:   Cervical, supraclavicular, and axillary nodes normal  Neurologic:   CNII-XII intact, normal strength, sensation and reflexes    throughout          Assessment & Plan:

## 2014-06-04 NOTE — Progress Notes (Signed)
Pre visit review using our clinic review tool, if applicable. No additional management support is needed unless otherwise documented below in the visit note. 

## 2014-06-05 ENCOUNTER — Other Ambulatory Visit: Payer: Self-pay | Admitting: General Practice

## 2014-06-05 MED ORDER — VITAMIN D (ERGOCALCIFEROL) 1.25 MG (50000 UNIT) PO CAPS: 50000.0000 [IU] | ORAL_CAPSULE | ORAL | Status: DC

## 2014-06-06 LAB — CYTOLOGY - PAP

## 2014-07-01 ENCOUNTER — Telehealth: Payer: Self-pay | Admitting: Family Medicine

## 2014-07-01 DIAGNOSIS — F988 Other specified behavioral and emotional disorders with onset usually occurring in childhood and adolescence: Secondary | ICD-10-CM

## 2014-07-01 MED ORDER — METHYLPHENIDATE HCL 10 MG PO TABS: 10.0000 mg | ORAL_TABLET | Freq: Three times a day (TID) | ORAL | Status: DC

## 2014-07-01 NOTE — Telephone Encounter (Signed)
Ok for 90

## 2014-07-01 NOTE — Telephone Encounter (Signed)
Med filled and pt notified.  

## 2014-07-01 NOTE — Telephone Encounter (Signed)
Last OV 06/04/14 Ritalin last filed 06/04/14 #90 with 0

## 2014-07-01 NOTE — Telephone Encounter (Signed)
Caller name: Jeffrey Relation to pt: self Call back number: [phone] Pharmacy:  Reason for call:   Requesting a new ritalin rx

## 2014-07-30 ENCOUNTER — Other Ambulatory Visit: Payer: Self-pay | Admitting: Family Medicine

## 2014-07-30 DIAGNOSIS — F988 Other specified behavioral and emotional disorders with onset usually occurring in childhood and adolescence: Secondary | ICD-10-CM

## 2014-07-30 MED ORDER — METHYLPHENIDATE HCL 10 MG PO TABS: 10.0000 mg | ORAL_TABLET | Freq: Three times a day (TID) | ORAL | Status: DC

## 2014-07-30 NOTE — Telephone Encounter (Signed)
Last OV 06-04-14 Ritalin last filled 07/01/14 #90 with 0

## 2014-07-30 NOTE — Telephone Encounter (Signed)
Caller name: Kaelen  Relation to pt: self Call back number: [phone] Pharmacy:  Reason for call:   Requesting ritalin refill

## 2014-07-30 NOTE — Telephone Encounter (Signed)
Med filled and pt notified.  

## 2014-08-29 ENCOUNTER — Telehealth: Payer: Self-pay | Admitting: Family Medicine

## 2014-08-29 DIAGNOSIS — F988 Other specified behavioral and emotional disorders with onset usually occurring in childhood and adolescence: Secondary | ICD-10-CM

## 2014-08-29 MED ORDER — METHYLPHENIDATE HCL 10 MG PO TABS: 10.0000 mg | ORAL_TABLET | Freq: Three times a day (TID) | ORAL | Status: DC

## 2014-08-29 NOTE — Telephone Encounter (Signed)
Caller name: Octa Relation to pt: self Call back number: [phone] Pharmacy:  Reason for call:   Requesting ritalin rx

## 2014-08-29 NOTE — Telephone Encounter (Signed)
Ok for 90

## 2014-08-29 NOTE — Telephone Encounter (Signed)
Last OV 06/04/14 Ritalin last filled 07/30/14 #90 with 0

## 2014-08-29 NOTE — Telephone Encounter (Signed)
Med filled and pt notified rx is at the front desk for pick up.

## 2014-09-27 ENCOUNTER — Other Ambulatory Visit: Payer: Self-pay | Admitting: Family Medicine

## 2014-09-27 DIAGNOSIS — F988 Other specified behavioral and emotional disorders with onset usually occurring in childhood and adolescence: Secondary | ICD-10-CM

## 2014-09-27 MED ORDER — METHYLPHENIDATE HCL 10 MG PO TABS: 10.0000 mg | ORAL_TABLET | Freq: Three times a day (TID) | ORAL | Status: DC

## 2014-09-27 NOTE — Telephone Encounter (Signed)
Last OV 06-04-14 Med filled 08-29-14 #90 with 0

## 2014-09-27 NOTE — Telephone Encounter (Signed)
Caller name: Anabel BeneKelly Asante Relationship to patient: self Can be reached: [phone]  Reason for call: Pt calling for refill on methylphenidate (RITALIN) 10 MG tablet. She has 2 pills left and takes 3/day. Please call her when ready to be picked up.

## 2014-09-27 NOTE — Telephone Encounter (Signed)
Med filled and pt notified.  

## 2014-09-30 ENCOUNTER — Ambulatory Visit (INDEPENDENT_AMBULATORY_CARE_PROVIDER_SITE_OTHER): Payer: BLUE CROSS/BLUE SHIELD | Admitting: Family

## 2014-09-30 ENCOUNTER — Encounter: Payer: Self-pay | Admitting: Family

## 2014-09-30 VITALS — BP 120/78 | HR 92 | Temp 98.6°F | Resp 16 | Ht 67.5 in | Wt 280.6 lb

## 2014-09-30 DIAGNOSIS — J029 Acute pharyngitis, unspecified: Secondary | ICD-10-CM

## 2014-09-30 DIAGNOSIS — J0110 Acute frontal sinusitis, unspecified: Secondary | ICD-10-CM

## 2014-09-30 MED ORDER — AMOXICILLIN-POT CLAVULANATE 875-125 MG PO TABS: 1.0000 | ORAL_TABLET | Freq: Two times a day (BID) | ORAL | Status: DC

## 2014-09-30 NOTE — Progress Notes (Signed)
Pre visit review using our clinic review tool, if applicable. No additional management support is needed unless otherwise documented below in the visit note. 

## 2014-09-30 NOTE — Patient Instructions (Signed)
Start augmentin. Call if symptoms worsen or if not improved in 3 days.   Sinusitis Sinusitis is redness, soreness, and inflammation of the paranasal sinuses. Paranasal sinuses are air pockets within the bones of your face (beneath the eyes, the middle of the forehead, or above the eyes). In healthy paranasal sinuses, mucus is able to drain out, and air is able to circulate through them by way of your nose. However, when your paranasal sinuses are inflamed, mucus and air can become trapped. This can allow bacteria and other germs to grow and cause infection. Sinusitis can develop quickly and last only a short time (acute) or continue over a long period (chronic). Sinusitis that lasts for more than 12 weeks is considered chronic.  CAUSES  Causes of sinusitis include:  Allergies.  Structural abnormalities, such as displacement of the cartilage that separates your nostrils (deviated septum), which can decrease the air flow through your nose and sinuses and affect sinus drainage.  Functional abnormalities, such as when the small hairs (cilia) that line your sinuses and help remove mucus do not work properly or are not present. SIGNS AND SYMPTOMS  Symptoms of acute and chronic sinusitis are the same. The primary symptoms are pain and pressure around the affected sinuses. Other symptoms include:  Upper toothache.  Earache.  Headache.  Bad breath.  Decreased sense of smell and taste.  A cough, which worsens when you are lying flat.  Fatigue.  Fever.  Thick drainage from your nose, which often is green and may contain pus (purulent).  Swelling and warmth over the affected sinuses. DIAGNOSIS  Your health care provider will perform a physical exam. During the exam, your health care provider may:  Look in your nose for signs of abnormal growths in your nostrils (nasal polyps).  Tap over the affected sinus to check for signs of infection.  View the inside of your sinuses (endoscopy)  using an imaging device that has a light attached (endoscope). If your health care provider suspects that you have chronic sinusitis, one or more of the following tests may be recommended:  Allergy tests.  Nasal culture. A sample of mucus is taken from your nose, sent to a lab, and screened for bacteria.  Nasal cytology. A sample of mucus is taken from your nose and examined by your health care provider to determine if your sinusitis is related to an allergy. TREATMENT  Most cases of acute sinusitis are related to a viral infection and will resolve on their own within 10 days. Sometimes medicines are prescribed to help relieve symptoms (pain medicine, decongestants, nasal steroid sprays, or saline sprays).  However, for sinusitis related to a bacterial infection, your health care provider will prescribe antibiotic medicines. These are medicines that will help kill the bacteria causing the infection.  Rarely, sinusitis is caused by a fungal infection. In theses cases, your health care provider will prescribe antifungal medicine. For some cases of chronic sinusitis, surgery is needed. Generally, these are cases in which sinusitis recurs more than 3 times per year, despite other treatments. HOME CARE INSTRUCTIONS   Drink plenty of water. Water helps thin the mucus so your sinuses can drain more easily.  Use a humidifier.  Inhale steam 3 to 4 times a day (for example, sit in the bathroom with the shower running).  Apply a warm, moist washcloth to your face 3 to 4 times a day, or as directed by your health care provider.  Use saline nasal sprays to help moisten and  clean your sinuses.  Take medicines only as directed by your health care provider.  If you were prescribed either an antibiotic or antifungal medicine, finish it all even if you start to feel better. SEEK IMMEDIATE MEDICAL CARE IF:  You have increasing pain or severe headaches.  You have nausea, vomiting, or drowsiness.  You  have swelling around your face.  You have vision problems.  You have a stiff neck.  You have difficulty breathing. MAKE SURE YOU:   Understand these instructions.  Will watch your condition.  Will get help right away if you are not doing well or get worse. Document Released: 04/12/2005 Document Revised: 08/27/2013 Document Reviewed: 04/27/2011 [facility] Patient Information 2015 [place], [place]. This information is not intended to replace advice given to you by your health care provider. Make sure you discuss any questions you have with your health care provider.

## 2014-09-30 NOTE — Assessment & Plan Note (Addendum)
Will rx with augmentin. Throat swab collected to screen for strep.

## 2014-09-30 NOTE — Progress Notes (Signed)
   Subjective:    Patient ID: Crystal Clements, female    DOB: June 08, 1990, 24 y.o.   MRN: [id number]  HPI  Ms. Crystal Clements is a 24 yr old female who presents today with chief complaint of sore throat.  Reports sore throat began 1 week ago and worsened 3 days ago. Reports + dry cough. Reports that her cough is sometimes productive.  Reports low grade temp last Tuesday- 99.5.  Has used dayquil severe cold/flu- did help last week but this weekend it was no longer helpful.   Review of Systems See HPI  Past Medical History  Diagnosis Date  . ADD (attention deficit disorder with hyperactivity)     History   Social History  . Marital Status: Single    Spouse Name: N/A  . Number of Children: N/A  . Years of Education: N/A   Occupational History  . Not on file.   Social History Main Topics  . Smoking status: Never Smoker   . Smokeless tobacco: Not on file  . Alcohol Use: 1.5 oz/week    3 drink(s) per week     Comment: occ.  . Drug Use: No  . Sexual Activity: Not on file   Other Topics Concern  . 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    Lives at home.    No past surgical history on file.  Family History  Problem Relation Age of Onset  . Breast cancer Maternal Grandmother     8440s  . Lung cancer Maternal Aunt     smoker  . Pancreatic cancer Maternal Grandmother     No Known Allergies  Current Outpatient Prescriptions on File Prior to Visit  Medication Sig Dispense Refill  . methylphenidate (RITALIN) 10 MG tablet Take 1 tablet (10 mg total) by mouth 3 (three) times daily. 90 tablet 0  . Multiple Vitamin (MULTIVITAMIN) capsule Take 1 capsule by mouth daily.     No current facility-administered medications on file prior to visit.    BP 120/78 mmHg  Pulse 92  Temp(Src) 98.6 F (37 C) (Oral)  Resp 16  Ht 5' 7.5" (1.715 m)  Wt 280 lb 9.6 oz (127.279 kg)  BMI 43.27 kg/m2  SpO2 98%  LMP 05/27/2014       Objective:     Physical Exam  Constitutional: She is oriented to person, place, and time. She appears well-developed and well-nourished.  HENT:  Head: Normocephalic and atraumatic.  Right Ear: Tympanic membrane and ear canal normal.  Left Ear: Tympanic membrane and ear canal normal.  Nose: Right sinus exhibits frontal sinus tenderness. Left sinus exhibits frontal sinus tenderness.  Minimal maxillary sinus tenderness to palpation. 2+ tonsils bilaterally, minimal erythema, no exudate  Cardiovascular: Normal rate, regular rhythm and normal heart sounds.   No murmur heard. Pulmonary/Chest: Effort normal and breath sounds normal. No respiratory distress. She has no wheezes.  Lymphadenopathy:    She has cervical adenopathy.  Neurological: She is alert and oriented to person, place, and time.  Skin: Skin is warm and dry.  Psychiatric: She has a normal mood and affect. Her behavior is normal. Judgment and thought content normal.          Assessment & Plan:

## 2014-10-01 ENCOUNTER — Encounter: Payer: Self-pay | Admitting: Family

## 2014-10-01 LAB — STREP A DNA PROBE: GASP: NEGATIVE

## 2014-10-08 MED ORDER — FLUTICASONE PROPIONATE 50 MCG/ACT NA SUSP: 2.0000 | Freq: Every day | NASAL | Status: DC

## 2014-10-08 MED ORDER — CLARITHROMYCIN ER 500 MG PO TB24: 1000.0000 mg | ORAL_TABLET | Freq: Every day | ORAL | Status: AC

## 2014-10-08 NOTE — Telephone Encounter (Signed)
flonase 2 sprays each nostril qd D/c augmentin------biaxin xl 2 po qd x 14 days (with biggest meal of day) If no relief -- ov

## 2014-10-08 NOTE — Addendum Note (Signed)
Addended by: Mervin Kung A on: 10/08/2014 04:21 PM   Modules accepted: Orders, Medications

## 2014-10-08 NOTE — Telephone Encounter (Addendum)
Notified pt and she voices understanding. Rx sent. Med list updated.

## 2014-10-29 ENCOUNTER — Telehealth: Payer: Self-pay | Admitting: Family Medicine

## 2014-10-29 DIAGNOSIS — F988 Other specified behavioral and emotional disorders with onset usually occurring in childhood and adolescence: Secondary | ICD-10-CM

## 2014-10-29 MED ORDER — METHYLPHENIDATE HCL 10 MG PO TABS: 10.0000 mg | ORAL_TABLET | Freq: Three times a day (TID) | ORAL | Status: DC

## 2014-10-29 NOTE — Telephone Encounter (Signed)
Med filled and pt notified.  

## 2014-10-29 NOTE — Telephone Encounter (Signed)
Ok for 90

## 2014-10-29 NOTE — Telephone Encounter (Signed)
Last OV 06-04-14 Ritalin last filled 09-27-14 #90 with 0

## 2014-10-29 NOTE — Telephone Encounter (Signed)
Caller name: Anabel BeneKelly Backstrom Relationship to patient: self Can be reached: [phone]  Reason for call: Pt called for refill on methylphenidate (RITALIN) 10 MG tablet. She has been out since yesterday. Taking 3/day. Please call pt when ready for pick up.

## 2014-11-27 ENCOUNTER — Telehealth: Payer: Self-pay | Admitting: Family Medicine

## 2014-11-27 DIAGNOSIS — F988 Other specified behavioral and emotional disorders with onset usually occurring in childhood and adolescence: Secondary | ICD-10-CM

## 2014-11-27 MED ORDER — METHYLPHENIDATE HCL 10 MG PO TABS: 10.0000 mg | ORAL_TABLET | Freq: Three times a day (TID) | ORAL | Status: DC

## 2014-11-27 NOTE — Telephone Encounter (Signed)
Ok for 90

## 2014-11-27 NOTE — Telephone Encounter (Signed)
Can be reached: [phone]  Reason for call: Pt needing refill on ritalin. Pt has enough for today only. Please call when ready. Advised pt to give 3-5 days notice in future.

## 2014-11-27 NOTE — Telephone Encounter (Signed)
Last OV 06-04-14 (CPE)  Ritalin last filled 10/29/14 #90 with 0

## 2014-11-27 NOTE — Telephone Encounter (Signed)
Med filled and pt notified available at front desk for pick up.  

## 2014-12-24 ENCOUNTER — Other Ambulatory Visit: Payer: Self-pay | Admitting: Family Medicine

## 2014-12-24 DIAGNOSIS — F988 Other specified behavioral and emotional disorders with onset usually occurring in childhood and adolescence: Secondary | ICD-10-CM

## 2014-12-24 MED ORDER — METHYLPHENIDATE HCL 10 MG PO TABS: 10.0000 mg | ORAL_TABLET | Freq: Three times a day (TID) | ORAL | Status: DC

## 2014-12-24 NOTE — Telephone Encounter (Signed)
Last OV 06-04-14 Ritalin last filled 11-27-14 #90 with 0

## 2014-12-24 NOTE — Telephone Encounter (Signed)
Pt call in to get a refill on her RITALIN Rx.   Call Back number : [phone].

## 2014-12-24 NOTE — Telephone Encounter (Signed)
Med filled and pt informed available at the front desk.  

## 2015-01-22 ENCOUNTER — Telehealth: Payer: Self-pay | Admitting: Family Medicine

## 2015-01-22 DIAGNOSIS — F988 Other specified behavioral and emotional disorders with onset usually occurring in childhood and adolescence: Secondary | ICD-10-CM

## 2015-01-22 MED ORDER — METHYLPHENIDATE HCL 10 MG PO TABS: 10.0000 mg | ORAL_TABLET | Freq: Three times a day (TID) | ORAL | Status: DC

## 2015-01-22 NOTE — Telephone Encounter (Signed)
Last OV 06/04/14 Ritalin last filled 12/24/14 #90 with 0

## 2015-01-22 NOTE — Telephone Encounter (Signed)
Pt called for refill on Ritalin. She has enough til Saturday. Please call her when ready to pick up [phone].

## 2015-01-22 NOTE — Telephone Encounter (Signed)
Med filled, pt notified available at front desk for pick up.  

## 2015-01-22 NOTE — Telephone Encounter (Signed)
Ok for 90

## 2015-02-20 ENCOUNTER — Other Ambulatory Visit: Payer: Self-pay | Admitting: Family Medicine

## 2015-02-20 DIAGNOSIS — F988 Other specified behavioral and emotional disorders with onset usually occurring in childhood and adolescence: Secondary | ICD-10-CM

## 2015-02-20 MED ORDER — METHYLPHENIDATE HCL 10 MG PO TABS: 10.0000 mg | ORAL_TABLET | Freq: Three times a day (TID) | ORAL | Status: DC

## 2015-02-20 NOTE — Telephone Encounter (Signed)
Last V 06/04/14 Ritalin last filled 01/22/15 #90 with 0

## 2015-02-20 NOTE — Telephone Encounter (Signed)
Caller name: Tresa EndoKelly   Relationship to patient: Self   Can be reached: [phone]    Reason for call: pt is requesting a refill on her RITALIN Rx

## 2015-02-20 NOTE — Telephone Encounter (Signed)
Med placed at front desk and pt informed.

## 2015-03-24 ENCOUNTER — Telehealth: Payer: Self-pay | Admitting: Family Medicine

## 2015-03-24 DIAGNOSIS — F988 Other specified behavioral and emotional disorders with onset usually occurring in childhood and adolescence: Secondary | ICD-10-CM

## 2015-03-24 MED ORDER — METHYLPHENIDATE HCL 10 MG PO TABS: 10.0000 mg | ORAL_TABLET | Freq: Three times a day (TID) | ORAL | Status: DC

## 2015-03-24 NOTE — Telephone Encounter (Signed)
Patient scheduled for physical appointment for 06/13/2015

## 2015-03-24 NOTE — Telephone Encounter (Signed)
Last OV 06-04-14 Ritalin last filled 02/20/15 #90 with 0

## 2015-03-24 NOTE — Telephone Encounter (Signed)
Ok for #90, no refills.  Please remind pt to schedule upcoming CPE if she hasn't already

## 2015-03-24 NOTE — Telephone Encounter (Signed)
Relation to ZO:XWRUpt:self Call back number:[phone]   Reason for call:  Patient requesting a refill methylphenidate (RITALIN) 10 MG tablet

## 2015-03-24 NOTE — Telephone Encounter (Signed)
Med filled, will inform pt available at front desk for pick up.

## 2015-04-22 ENCOUNTER — Telehealth: Payer: Self-pay | Admitting: Family Medicine

## 2015-04-22 DIAGNOSIS — F988 Other specified behavioral and emotional disorders with onset usually occurring in childhood and adolescence: Secondary | ICD-10-CM

## 2015-04-22 MED ORDER — METHYLPHENIDATE HCL 10 MG PO TABS: 10.0000 mg | ORAL_TABLET | Freq: Three times a day (TID) | ORAL | Status: DC

## 2015-04-22 NOTE — Telephone Encounter (Signed)
Ok for #90, please remind her she is due for f/u visit in Feb if not already scheduled

## 2015-04-22 NOTE — Telephone Encounter (Signed)
Caller name: Self   Can be reached: [phone]  Pharmacy:  [facility] [id number] - [place], [place] - [address] [facility] [address] [phone] (Phone) [phone] (Fax)         Reason for call: Refill on methylphenidate (RITALIN) 10 MG tablet [[id number]][[id number]]

## 2015-04-22 NOTE — Telephone Encounter (Signed)
Med filled and pt aware available at front desk for pick up.

## 2015-04-22 NOTE — Telephone Encounter (Signed)
Last OV 06/04/14 Ritalin last filled 03/24/15 #90 with 0

## 2015-05-21 ENCOUNTER — Other Ambulatory Visit: Payer: Self-pay

## 2015-05-21 ENCOUNTER — Telehealth: Payer: Self-pay | Admitting: Family Medicine

## 2015-05-21 DIAGNOSIS — F988 Other specified behavioral and emotional disorders with onset usually occurring in childhood and adolescence: Secondary | ICD-10-CM

## 2015-05-21 MED ORDER — METHYLPHENIDATE HCL 10 MG PO TABS: 10.0000 mg | ORAL_TABLET | Freq: Three times a day (TID) | ORAL | Status: DC

## 2015-05-21 NOTE — Telephone Encounter (Signed)
Ok for refill? 

## 2015-05-21 NOTE — Telephone Encounter (Signed)
Left message for patient to pick up Rx at front office.

## 2015-05-21 NOTE — Telephone Encounter (Signed)
Pt's mom came in to pick up pt's Rx.

## 2015-05-21 NOTE — Telephone Encounter (Signed)
Pt needing refill on ritalin. She has enough for tomorrow. Please call [phone] when RX ready for pick up.

## 2015-06-13 ENCOUNTER — Encounter: Payer: Self-pay | Admitting: Family Medicine

## 2015-06-13 ENCOUNTER — Ambulatory Visit (INDEPENDENT_AMBULATORY_CARE_PROVIDER_SITE_OTHER): Payer: BLUE CROSS/BLUE SHIELD | Admitting: Family Medicine

## 2015-06-13 VITALS — BP 124/90 | HR 90 | Temp 98.5°F | Ht 68.0 in | Wt 282.6 lb

## 2015-06-13 DIAGNOSIS — Z0000 Encounter for general adult medical examination without abnormal findings: Secondary | ICD-10-CM | POA: Diagnosis not present

## 2015-06-13 LAB — CBC WITH DIFFERENTIAL/PLATELET
BASOS ABS: 0 10*3/uL (ref 0.0–0.1)
Basophils Relative: 0.3 % (ref 0.0–3.0)
EOS ABS: 0.2 10*3/uL (ref 0.0–0.7)
Eosinophils Relative: 2 % (ref 0.0–5.0)
HEMATOCRIT: 42.8 % (ref 36.0–46.0)
HEMOGLOBIN: 14.1 g/dL (ref 12.0–15.0)
LYMPHS PCT: 27.7 % (ref 12.0–46.0)
Lymphs Abs: 3.1 10*3/uL (ref 0.7–4.0)
MCHC: 33.1 g/dL (ref 30.0–36.0)
MCV: 82.3 fl (ref 78.0–100.0)
MONOS PCT: 4.8 % (ref 3.0–12.0)
Monocytes Absolute: 0.5 10*3/uL (ref 0.1–1.0)
NEUTROS ABS: 7.3 10*3/uL (ref 1.4–7.7)
Neutrophils Relative %: 65.2 % (ref 43.0–77.0)
PLATELETS: 282 10*3/uL (ref 150.0–400.0)
RBC: 5.2 Mil/uL — AB (ref 3.87–5.11)
RDW: 14.6 % (ref 11.5–15.5)
WBC: 11.2 10*3/uL — AB (ref 4.0–10.5)

## 2015-06-13 LAB — LIPID PANEL
CHOLESTEROL: 181 mg/dL (ref 0–200)
HDL: 43.8 mg/dL (ref >39.00)
LDL CALC: 123 mg/dL — AB (ref 0–99)
NonHDL: 137.46
Total CHOL/HDL Ratio: 4
Triglycerides: 74 mg/dL (ref 0.0–149.0)
VLDL: 14.8 mg/dL (ref 0.0–40.0)

## 2015-06-13 LAB — HEPATIC FUNCTION PANEL
ALBUMIN: 4.1 g/dL (ref 3.5–5.2)
ALT: 16 U/L (ref 0–35)
AST: 19 U/L (ref 0–37)
Alkaline Phosphatase: 88 U/L (ref 39–117)
BILIRUBIN DIRECT: 0 mg/dL (ref 0.0–0.3)
TOTAL PROTEIN: 7.2 g/dL (ref 6.0–8.3)
Total Bilirubin: 0.3 mg/dL (ref 0.2–1.2)

## 2015-06-13 LAB — BASIC METABOLIC PANEL
BUN: 10 mg/dL (ref 6–23)
CALCIUM: 9.1 mg/dL (ref 8.4–10.5)
CO2: 27 meq/L (ref 19–32)
CREATININE: 0.7 mg/dL (ref 0.40–1.20)
Chloride: 106 mEq/L (ref 96–112)
GFR: 107.25 mL/min (ref >60.00)
GLUCOSE: 102 mg/dL — AB (ref 70–99)
Potassium: 4.2 mEq/L (ref 3.5–5.1)
SODIUM: 141 meq/L (ref 135–145)

## 2015-06-13 LAB — VITAMIN D 25 HYDROXY (VIT D DEFICIENCY, FRACTURES): VITD: 21.7 ng/mL — ABNORMAL LOW (ref 30.00–100.00)

## 2015-06-13 LAB — TSH: TSH: 4.34 u[IU]/mL (ref 0.35–4.50)

## 2015-06-13 NOTE — Patient Instructions (Signed)
Follow up in 1 year or as needed We'll notify you of your lab results and make any changes Continue to work on healthy diet and regular exercise- you can do it! Call with any questions or concerns If you want to join [place] at the new [place] office, any scheduled appointments will automatically transfer and we will see you at [address] [place] [address], [place], [place] [address] [facility]) Have a great weekend!!!

## 2015-06-13 NOTE — Progress Notes (Signed)
   Subjective:    Patient ID: Crystal Clements, female    DOB: [birth year], 25 y.o.   MRN: [id number]  HPI CPE- UTD on pap (done last year).  Walking regularly plans to start yoga.  Has changed diet   Review of Systems Patient reports no vision/ hearing changes, adenopathy,fever, weight change,  persistant/recurrent hoarseness , swallowing issues, chest pain, palpitations, edema, persistant/recurrent cough, hemoptysis, dyspnea (rest/exertional/paroxysmal nocturnal), gastrointestinal bleeding (melena, rectal bleeding), abdominal pain, significant heartburn, bowel changes, GU symptoms (dysuria, hematuria, incontinence), Gyn symptoms (abnormal  bleeding, pain),  syncope, focal weakness, memory loss, numbness & tingling, skin/hair/nail changes, abnormal bruising or bleeding, anxiety, or depression.     Objective:   Physical Exam General Appearance:    Alert, cooperative, no distress, appears stated age, obese  Head:    Normocephalic, without obvious abnormality, atraumatic  Eyes:    PERRL, conjunctiva/corneas clear, EOM's intact, fundi    benign, both eyes  Ears:    Normal TM's and external ear canals, both ears  Nose:   Nares normal, septum midline, mucosa normal, no drainage    or sinus tenderness  Throat:   Lips, mucosa, and tongue normal; teeth and gums normal  Neck:   Supple, symmetrical, trachea midline, no adenopathy;    Thyroid: no enlargement/tenderness/nodules  Back:     Symmetric, no curvature, ROM normal, no CVA tenderness  Lungs:     Clear to auscultation bilaterally, respirations unlabored  Chest Wall:    No tenderness or deformity   Heart:    Regular rate and rhythm, S1 and S2 normal, no murmur, rub   or gallop  Breast Exam:    Deferred  Abdomen:     Soft, non-tender, bowel sounds active all four quadrants,    no masses, no organomegaly  Genitalia:    Deferred  Rectal:    Extremities:   Extremities normal, atraumatic, no cyanosis or edema  Pulses:   2+ and symmetric all  extremities  Skin:   Skin color, texture, turgor normal, no rashes or lesions  Lymph nodes:   Cervical, supraclavicular, and axillary nodes normal  Neurologic:   CNII-XII intact, normal strength, sensation and reflexes    throughout          Assessment & Plan:

## 2015-06-13 NOTE — Assessment & Plan Note (Signed)
Pt's PE WNL w/ exception of obesity.  UTD on pap.  Check labs.  Stressed need for healthy diet and regular exercise.  Anticipatory guidance provided.

## 2015-06-13 NOTE — Progress Notes (Signed)
Pre visit review using our clinic review tool, if applicable. No additional management support is needed unless otherwise documented below in the visit note. 

## 2015-06-16 ENCOUNTER — Telehealth: Payer: Self-pay | Admitting: Family Medicine

## 2015-06-16 ENCOUNTER — Other Ambulatory Visit: Payer: Self-pay | Admitting: General Practice

## 2015-06-16 MED ORDER — VITAMIN D (ERGOCALCIFEROL) 1.25 MG (50000 UNIT) PO CAPS: 50000.0000 [IU] | ORAL_CAPSULE | ORAL | Status: DC

## 2015-06-16 NOTE — Telephone Encounter (Signed)
Pt called for refill on ritalin. She will be out Thursday. Takes 3/day. Call when RX ready at [phone].

## 2015-06-16 NOTE — Telephone Encounter (Signed)
Ok for #90, no refills 

## 2015-06-17 ENCOUNTER — Other Ambulatory Visit: Payer: Self-pay

## 2015-06-17 DIAGNOSIS — F988 Other specified behavioral and emotional disorders with onset usually occurring in childhood and adolescence: Secondary | ICD-10-CM

## 2015-06-17 MED ORDER — METHYLPHENIDATE HCL 10 MG PO TABS: 10.0000 mg | ORAL_TABLET | Freq: Three times a day (TID) | ORAL | Status: DC

## 2015-06-17 NOTE — Telephone Encounter (Signed)
Left message for patient that RX will be at front desk for pick up.

## 2015-07-15 ENCOUNTER — Other Ambulatory Visit: Payer: Self-pay | Admitting: Family Medicine

## 2015-07-15 DIAGNOSIS — F988 Other specified behavioral and emotional disorders with onset usually occurring in childhood and adolescence: Secondary | ICD-10-CM

## 2015-07-15 NOTE — Telephone Encounter (Signed)
Pt called for refill on ritalin 10mg  tablet, taking 3/day, has enough til Friday, call when RX ready for pick up [phone].

## 2015-07-16 NOTE — Telephone Encounter (Signed)
Last OV 06/13/15 Ritalin last filled 06/17/15 #90 with 0

## 2015-07-17 MED ORDER — METHYLPHENIDATE HCL 10 MG PO TABS: 10.0000 mg | ORAL_TABLET | Freq: Three times a day (TID) | ORAL | Status: DC

## 2015-07-17 NOTE — Telephone Encounter (Signed)
Ok for 90

## 2015-07-17 NOTE — Telephone Encounter (Signed)
Medication filled to pharmacy as requested.   

## 2015-07-17 NOTE — Telephone Encounter (Signed)
Med filled and pt informed available at the front desk for pick up, [place].  

## 2015-08-13 ENCOUNTER — Other Ambulatory Visit: Payer: Self-pay | Admitting: Family Medicine

## 2015-08-13 ENCOUNTER — Telehealth: Payer: Self-pay | Admitting: Family Medicine

## 2015-08-13 DIAGNOSIS — F988 Other specified behavioral and emotional disorders with onset usually occurring in childhood and adolescence: Secondary | ICD-10-CM

## 2015-08-13 MED ORDER — METHYLPHENIDATE HCL 10 MG PO TABS
10.0000 mg | ORAL_TABLET | Freq: Three times a day (TID) | ORAL | Status: DC
Start: 2015-08-13 — End: 2015-09-12

## 2015-08-13 NOTE — Telephone Encounter (Signed)
Pt needs refill on ritalin

## 2015-08-13 NOTE — Telephone Encounter (Signed)
Med filled and pt advised available at the front desk of the [place] location.

## 2015-08-13 NOTE — Telephone Encounter (Signed)
Last OV 06/13/15 Ritalin last filled 07/17/15 #90 with 0

## 2015-09-12 ENCOUNTER — Other Ambulatory Visit: Payer: Self-pay | Admitting: Family Medicine

## 2015-09-12 DIAGNOSIS — F988 Other specified behavioral and emotional disorders with onset usually occurring in childhood and adolescence: Secondary | ICD-10-CM

## 2015-09-12 MED ORDER — METHYLPHENIDATE HCL 10 MG PO TABS: 10.0000 mg | ORAL_TABLET | Freq: Three times a day (TID) | ORAL | Status: DC

## 2015-09-12 NOTE — Telephone Encounter (Signed)
Pt needs refill on ritalin

## 2015-09-12 NOTE — Telephone Encounter (Signed)
LMOVM to inform pt available for pick up.

## 2015-09-12 NOTE — Telephone Encounter (Signed)
Last OV 06/13/15 Ritalin last filled 08/13/15 #90 with 0

## 2015-10-13 ENCOUNTER — Other Ambulatory Visit: Payer: Self-pay | Admitting: Family Medicine

## 2015-10-13 DIAGNOSIS — F988 Other specified behavioral and emotional disorders with onset usually occurring in childhood and adolescence: Secondary | ICD-10-CM

## 2015-10-13 MED ORDER — METHYLPHENIDATE HCL 10 MG PO TABS: 10.0000 mg | ORAL_TABLET | Freq: Three times a day (TID) | ORAL | Status: DC

## 2015-10-13 NOTE — Telephone Encounter (Signed)
Med filled and pt advised available at front desk for pick up.  

## 2015-10-13 NOTE — Telephone Encounter (Signed)
Last OV 06/13/15 Ritalin last filled 09/12/15 #90 with 0

## 2015-10-13 NOTE — Telephone Encounter (Signed)
Pt needs refill on ritalin

## 2015-11-12 ENCOUNTER — Other Ambulatory Visit: Payer: Self-pay | Admitting: Family Medicine

## 2015-11-12 DIAGNOSIS — F988 Other specified behavioral and emotional disorders with onset usually occurring in childhood and adolescence: Secondary | ICD-10-CM

## 2015-11-12 MED ORDER — METHYLPHENIDATE HCL 10 MG PO TABS: 10.0000 mg | ORAL_TABLET | Freq: Three times a day (TID) | ORAL | Status: DC

## 2015-11-12 NOTE — Telephone Encounter (Signed)
Pt needs refill on ritalin

## 2015-11-12 NOTE — Telephone Encounter (Signed)
Last OV 06/13/15 Ritalin last filled 10/13/15 #90 with 0

## 2015-11-12 NOTE — Telephone Encounter (Signed)
Pt informed med filled and rx available at front desk for pick up.

## 2015-12-12 ENCOUNTER — Other Ambulatory Visit: Payer: Self-pay | Admitting: Family Medicine

## 2015-12-12 DIAGNOSIS — F988 Other specified behavioral and emotional disorders with onset usually occurring in childhood and adolescence: Secondary | ICD-10-CM

## 2015-12-12 MED ORDER — METHYLPHENIDATE HCL 10 MG PO TABS: 10.0000 mg | ORAL_TABLET | Freq: Three times a day (TID) | ORAL | 0 refills | Status: DC

## 2015-12-12 NOTE — Telephone Encounter (Signed)
Med filled and pt informed available at front desk for pick up.  

## 2015-12-12 NOTE — Telephone Encounter (Signed)
Last OV 06/13/15 Ritalin last filled 11/12/15 #90 with 0

## 2015-12-12 NOTE — Telephone Encounter (Signed)
Patient calling to request refill of methylphenidate (RITALIN) 10 MG tablet.  Please call when ready to pick up.

## 2016-01-09 ENCOUNTER — Other Ambulatory Visit: Payer: Self-pay | Admitting: Family Medicine

## 2016-01-09 DIAGNOSIS — F988 Other specified behavioral and emotional disorders with onset usually occurring in childhood and adolescence: Secondary | ICD-10-CM

## 2016-01-09 MED ORDER — METHYLPHENIDATE HCL 10 MG PO TABS: 10.0000 mg | ORAL_TABLET | Freq: Three times a day (TID) | ORAL | 0 refills | Status: DC

## 2016-01-09 NOTE — Telephone Encounter (Signed)
Pt needs refill on ritalin, pt states that she will run out on Sunday

## 2016-01-09 NOTE — Telephone Encounter (Signed)
Last OV 06/13/15 Ritalin last filled 12/12/15 #90 with 0

## 2016-01-09 NOTE — Telephone Encounter (Signed)
Pt made aware rx available for pick up at front desk for pick up.

## 2016-01-27 DIAGNOSIS — Z23 Encounter for immunization: Secondary | ICD-10-CM | POA: Diagnosis not present

## 2016-02-09 ENCOUNTER — Other Ambulatory Visit: Payer: Self-pay | Admitting: Family Medicine

## 2016-02-09 DIAGNOSIS — F988 Other specified behavioral and emotional disorders with onset usually occurring in childhood and adolescence: Secondary | ICD-10-CM

## 2016-02-09 MED ORDER — METHYLPHENIDATE HCL 10 MG PO TABS: 10.0000 mg | ORAL_TABLET | Freq: Three times a day (TID) | ORAL | 0 refills | Status: DC

## 2016-02-09 NOTE — Telephone Encounter (Signed)
Last OV 06/13/15 Ritalin last filled 01/09/16 #90 with 0

## 2016-02-09 NOTE — Telephone Encounter (Signed)
Pt informed rx available at front desk for pick up.  

## 2016-02-09 NOTE — Telephone Encounter (Signed)
Pt needs refill on ritalin

## 2016-05-11 ENCOUNTER — Other Ambulatory Visit: Payer: Self-pay | Admitting: Family Medicine

## 2016-05-11 DIAGNOSIS — F988 Other specified behavioral and emotional disorders with onset usually occurring in childhood and adolescence: Secondary | ICD-10-CM

## 2016-05-11 MED ORDER — METHYLPHENIDATE HCL 10 MG PO TABS: 10.0000 mg | ORAL_TABLET | Freq: Three times a day (TID) | ORAL | 0 refills | Status: DC

## 2016-05-11 NOTE — Telephone Encounter (Signed)
Ok for 3 scripts, each can be for 31 to eliminate the problem

## 2016-05-11 NOTE — Telephone Encounter (Signed)
Patient calling to request refill of methylphenidate (RITALIN) 10 MG tablet.  She also wants pcp to know that when this medication was refilled last in October, it was written for 3 months at at time.  However, October had 31 days and since the November refill stated it could not be refilled until after the 16th, she went a day without her medication.  Patient is requesting to either have script written for 3 months but allowing enough meds for months with 31 days or if that doesn't work then [employer] write script for 30 day supply and she will pick up script each month.

## 2016-05-11 NOTE — Telephone Encounter (Signed)
Last OV 06/13/15 Ritalin last filled 3 months on 02/09/16

## 2016-06-09 ENCOUNTER — Ambulatory Visit (INDEPENDENT_AMBULATORY_CARE_PROVIDER_SITE_OTHER): Payer: BLUE CROSS/BLUE SHIELD | Admitting: Family Medicine

## 2016-06-09 ENCOUNTER — Encounter: Payer: Self-pay | Admitting: Family Medicine

## 2016-06-09 VITALS — BP 148/100 | HR 79 | Temp 98.3°F | Resp 18 | Wt 266.8 lb

## 2016-06-09 DIAGNOSIS — J0100 Acute maxillary sinusitis, unspecified: Secondary | ICD-10-CM

## 2016-06-09 DIAGNOSIS — R05 Cough: Secondary | ICD-10-CM | POA: Diagnosis not present

## 2016-06-09 DIAGNOSIS — R059 Cough, unspecified: Secondary | ICD-10-CM

## 2016-06-09 DIAGNOSIS — R030 Elevated blood-pressure reading, without diagnosis of hypertension: Secondary | ICD-10-CM

## 2016-06-09 MED ORDER — HYDROCODONE-HOMATROPINE 5-1.5 MG/5ML PO SYRP: 5.0000 mL | ORAL_SOLUTION | Freq: Three times a day (TID) | ORAL | 0 refills | Status: DC | PRN

## 2016-06-09 NOTE — Progress Notes (Signed)
Pre visit review using our clinic review tool, if applicable. No additional management support is needed unless otherwise documented below in the visit note. 

## 2016-06-09 NOTE — Progress Notes (Signed)
Subjective:    Patient ID: Crystal Clements, female    DOB: [birth year], 26 y.o.   MRN: [id number]  HPI This is a 26 yo female who presents today with 8 days of nasal congestion and cough. Drainage is light green, teeth are hurting, feels like she can't get enough out of nose, ears feel muffled, headache at temples and behind eyes. No fever. No wheezing or SOB. No dizziness or visual changes. Feels a little better today than she did several days ago.  Taking mucinex, decongestant- Sudafed 2 tablets several times a day, zicam and OTC cough syrup (DM formula), without much improvement. Sleeping ok. Tylenol with some relief. Occasional seasonal allergies.  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    Past Medical History:  Diagnosis Date  . ADD (attention deficit disorder with hyperactivity)    No past surgical history on file. Family History  Problem Relation Age of Onset  . Breast cancer Maternal Grandmother     6940s  . Lung cancer Maternal Aunt     smoker  . Pancreatic cancer Maternal Grandmother    Social History  Substance Use Topics  . Smoking status: Never Smoker  . Smokeless tobacco: Not on file  . Alcohol use 1.5 oz/week    3 drink(s) per week     Comment: occ.      Review of Systems Per HPI    Objective:   Physical Exam  Constitutional: She is oriented to person, place, and time. She appears well-developed and well-nourished. No distress.  HENT:  Head: Normocephalic and atraumatic.  Right Ear: Tympanic membrane, external ear and ear canal normal.  Left Ear: Tympanic membrane and external ear normal.  Nose: Mucosal edema present. Right sinus exhibits maxillary sinus tenderness and frontal sinus tenderness. Left sinus exhibits maxillary sinus tenderness and frontal sinus tenderness.  Mouth/Throat: Uvula is midline and oropharynx is clear and moist. No oropharyngeal exudate.  Sounds congested.   Eyes: Conjunctivae are  normal.  Neck: Normal range of motion. Neck supple.  Cardiovascular: Normal rate, regular rhythm and normal heart sounds.   Pulmonary/Chest: Effort normal and breath sounds normal. No respiratory distress. She has no wheezes. She has no rales.  Occasional deep, non productive cough.   Lymphadenopathy:    She has no cervical adenopathy.  Neurological: She is alert and oriented to person, place, and time.  Skin: Skin is warm and dry. She is not diaphoretic.  Psychiatric: She has a normal mood and affect. Her behavior is normal. Judgment and thought content normal.  Vitals reviewed.    BP (!) 148/100 (BP Location: Left Arm, Patient Position: Sitting, Cuff Size: Normal)   Pulse 79   Temp 98.3 F (36.8 C) (Oral)   Resp 18   Wt 266 lb 12.8 oz (121 kg)   LMP 05/24/2016   SpO2 98%   BMI 40.57 kg/m  Wt Readings from Last 3 Encounters:  06/09/16 266 lb 12.8 oz (121 kg)  06/13/15 282 lb 9.6 oz (128.2 kg)  09/30/14 280 lb 9.6 oz (127.3 kg)    Recheck BP- 154/98  BP Readings from Last 3 Encounters:  06/09/16 (!) 148/100  06/13/15 124/90  09/30/14 120/78       Assessment & Plan:  1. Cough - suspect viral illness, feeling better today, hopefully turning the corner - HYDROcodone-homatropine (HYCODAN) 5-1.5 MG/5ML syrup; Take 5 mLs by mouth every 8 (eight) hours as needed for cough.  Dispense: 120 mL; Refill:  0  2. Acute non-recurrent maxillary sinusitis - see #1 - Provided written and verbal information regarding diagnosis and treatment. - RTC precautions reviewed. Consider antibiotic is not better in 5 days.  -  Patient Instructions  Continue Mucinex (generic is fine), sudafed- take 1 tablet twice a day.  Add Afrin nasal spray twice a day for maximum of 3 days, can also use saline nasal spray 4-6 times a day Try ibuprofen 2 tablets every 12 hours for headache If not better in 5 days, please send me a Mychart message.   3. Elevated blood pressure reading - likely related to  current illness and Sudafed use as previous readings normal , she was instructed to decrease decongestant to 1 tablet twice a day, increase hydration - RTC/ED if severe headache, dizziness or visual changes occur - she will follow up with PCP, Dr. Coralyn Mark, FNP-BC  [facility] [place], [facility]  06/09/2016 4:37 PM

## 2016-06-09 NOTE — Patient Instructions (Addendum)
Continue Mucinex (generic is fine), sudafed- take 1 tablet twice a day.  Add Afrin nasal spray twice a day for maximum of 3 days, can also use saline nasal spray 4-6 times a day Try ibuprofen 2 tablets every 12 hours for headache If not better in 5 days, please send me a Mychart message.

## 2016-08-09 ENCOUNTER — Encounter: Payer: Self-pay | Admitting: General Practice

## 2016-08-09 ENCOUNTER — Other Ambulatory Visit: Payer: Self-pay | Admitting: Family Medicine

## 2016-08-09 DIAGNOSIS — F988 Other specified behavioral and emotional disorders with onset usually occurring in childhood and adolescence: Secondary | ICD-10-CM

## 2016-08-09 MED ORDER — METHYLPHENIDATE HCL 10 MG PO TABS: 10.0000 mg | ORAL_TABLET | Freq: Three times a day (TID) | ORAL | 0 refills | Status: DC

## 2016-08-09 NOTE — Telephone Encounter (Signed)
mychart message sent to pt to inform.

## 2016-08-09 NOTE — Telephone Encounter (Signed)
Last OV 06/13/15 (CPE), no upcoming appts Ritalin last filled 05/11/16 #93 with 3 prescriptions

## 2016-08-09 NOTE — Telephone Encounter (Signed)
Pt needs refill on Ritalin

## 2016-08-09 NOTE — Telephone Encounter (Signed)
This will be last refill w/o appt 

## 2016-09-06 ENCOUNTER — Encounter: Payer: Self-pay | Admitting: Family Medicine

## 2016-09-06 ENCOUNTER — Ambulatory Visit (INDEPENDENT_AMBULATORY_CARE_PROVIDER_SITE_OTHER): Payer: BLUE CROSS/BLUE SHIELD | Admitting: Family Medicine

## 2016-09-06 VITALS — BP 119/83 | HR 76 | Temp 98.3°F | Resp 16 | Ht 68.0 in | Wt 271.1 lb

## 2016-09-06 DIAGNOSIS — F988 Other specified behavioral and emotional disorders with onset usually occurring in childhood and adolescence: Secondary | ICD-10-CM

## 2016-09-06 LAB — LIPID PANEL
CHOL/HDL RATIO: 4
Cholesterol: 195 mg/dL (ref 0–200)
HDL: 45.2 mg/dL (ref >39.00)
LDL Cholesterol: 129 mg/dL — ABNORMAL HIGH (ref 0–99)
NONHDL: 149.93
Triglycerides: 107 mg/dL (ref 0.0–149.0)
VLDL: 21.4 mg/dL (ref 0.0–40.0)

## 2016-09-06 LAB — HEMOGLOBIN A1C: Hgb A1c MFr Bld: 5.2 % (ref 4.6–6.5)

## 2016-09-06 LAB — BASIC METABOLIC PANEL
BUN: 12 mg/dL (ref 6–23)
CO2: 28 mEq/L (ref 19–32)
CREATININE: 0.71 mg/dL (ref 0.40–1.20)
Calcium: 9.3 mg/dL (ref 8.4–10.5)
Chloride: 107 mEq/L (ref 96–112)
GFR: 104.53 mL/min (ref >60.00)
Glucose, Bld: 82 mg/dL (ref 70–99)
Potassium: 4.5 mEq/L (ref 3.5–5.1)
Sodium: 144 mEq/L (ref 135–145)

## 2016-09-06 LAB — HEPATIC FUNCTION PANEL
ALT: 12 U/L (ref 0–35)
AST: 15 U/L (ref 0–37)
Albumin: 4.3 g/dL (ref 3.5–5.2)
Alkaline Phosphatase: 84 U/L (ref 39–117)
BILIRUBIN DIRECT: 0.1 mg/dL (ref 0.0–0.3)
BILIRUBIN TOTAL: 0.7 mg/dL (ref 0.2–1.2)
TOTAL PROTEIN: 7 g/dL (ref 6.0–8.3)

## 2016-09-06 LAB — CBC WITH DIFFERENTIAL/PLATELET
BASOS PCT: 1 % (ref 0.0–3.0)
Basophils Absolute: 0.1 10*3/uL (ref 0.0–0.1)
EOS ABS: 0.2 10*3/uL (ref 0.0–0.7)
EOS PCT: 2.5 % (ref 0.0–5.0)
HEMATOCRIT: 43.3 % (ref 36.0–46.0)
HEMOGLOBIN: 14.1 g/dL (ref 12.0–15.0)
Lymphocytes Relative: 27 % (ref 12.0–46.0)
Lymphs Abs: 2.6 10*3/uL (ref 0.7–4.0)
MCHC: 32.5 g/dL (ref 30.0–36.0)
MCV: 86.3 fl (ref 78.0–100.0)
Monocytes Absolute: 0.5 10*3/uL (ref 0.1–1.0)
Monocytes Relative: 5.5 % (ref 3.0–12.0)
Neutro Abs: 6.1 10*3/uL (ref 1.4–7.7)
Neutrophils Relative %: 64 % (ref 43.0–77.0)
PLATELETS: 293 10*3/uL (ref 150.0–400.0)
RBC: 5.02 Mil/uL (ref 3.87–5.11)
RDW: 14.3 % (ref 11.5–15.5)
WBC: 9.5 10*3/uL (ref 4.0–10.5)

## 2016-09-06 LAB — TSH: TSH: 2.15 u[IU]/mL (ref 0.35–4.50)

## 2016-09-06 NOTE — Assessment & Plan Note (Signed)
Chronic problem.  Well controlled today.  Asymptomatic.  Tolerating stimulant w/o side effects.  Will follow.

## 2016-09-06 NOTE — Progress Notes (Signed)
Pre visit review using our clinic review tool, if applicable. No additional management support is needed unless otherwise documented below in the visit note. 

## 2016-09-06 NOTE — Progress Notes (Signed)
   Subjective:    Patient ID: Crystal Clements, female    DOB: 13-Dec-1990, 26 y.o.   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  On Ritalin 10mg  TID w/ good symptom control.  Pt reports that the last 2 months has been very stressful but even with this, the medication has been effective.  No palpitations, insomnia, anorexia.  No CP, SOB, HAs, visual changes.  Obesity- ongoing issue.  Pt has gained another 4 lbs.  Pt reports doing home exercise program- ellipitical, resistance bands, weights.  BMI now 41.22   Review of Systems For ROS see HPI     Objective:   Physical Exam  Constitutional: She is oriented to person, place, and time. She appears well-developed and well-nourished. No distress.  obese  HENT:  Head: Normocephalic and atraumatic.  Eyes: Conjunctivae and EOM are normal. Pupils are equal, round, and reactive to light.  Neck: Normal range of motion. Neck supple. No thyromegaly present.  Cardiovascular: Normal rate, regular rhythm, normal heart sounds and intact distal pulses.   No murmur heard. Pulmonary/Chest: Effort normal and breath sounds normal. No respiratory distress.  Abdominal: Soft. She exhibits no distension. There is no tenderness.  Musculoskeletal: She exhibits no edema.  Lymphadenopathy:    She has no cervical adenopathy.  Neurological: She is alert and oriented to person, place, and time.  Skin: Skin is warm and dry.  Psychiatric: She has a normal mood and affect. Her behavior is normal.  Vitals reviewed.         Assessment & Plan:

## 2016-09-06 NOTE — Patient Instructions (Signed)
Schedule your complete physical in 6 months We'll notify you of your lab results and make any changes if needed Continue to work on healthy diet and regular exercise- you can do it! Call with any questions or concerns Here's to relaxing!!!

## 2016-09-06 NOTE — Assessment & Plan Note (Signed)
Ongoing issue for pt.  She has gained 4 lbs since last visit.  Stressed need for healthy diet and regular exercise.  Check labs to risk stratify.  Will follow. 

## 2016-09-08 ENCOUNTER — Telehealth: Payer: Self-pay | Admitting: *Deleted

## 2016-09-08 NOTE — Telephone Encounter (Signed)
Patient called in regarding menstrual cycle.  Stated that this cycle is very heavy, which is something she has never had before. Patient states that she is going through feminine products about every hour, passing clots and she is concerned because she has never had this happen before.    PCP was not readily available, so I discussed with PA  -  PA stated that it is not unusual to have a heavier cycle and clots - however, if it does not calm down in the next day patient would probably need to be seen for evaluation.   If patient starts to feel lightheaded/dizziness, PA stated that she needs to go to the Emergency Room.. Patient stated verbal understanding.   She will call back tomorrow for an appointment if things do not settle down.

## 2016-11-08 ENCOUNTER — Other Ambulatory Visit: Payer: Self-pay | Admitting: Family Medicine

## 2016-11-08 DIAGNOSIS — F988 Other specified behavioral and emotional disorders with onset usually occurring in childhood and adolescence: Secondary | ICD-10-CM

## 2016-11-08 MED ORDER — METHYLPHENIDATE HCL 10 MG PO TABS: 10.0000 mg | ORAL_TABLET | Freq: Three times a day (TID) | ORAL | 0 refills | Status: DC

## 2016-11-08 NOTE — Telephone Encounter (Signed)
Last OV 09/06/16 Ritalin last filled 08/09/16 #93 tablets with 0 (3 months given)

## 2016-11-08 NOTE — Telephone Encounter (Signed)
Pt needs refill on ritalin

## 2016-11-08 NOTE — Telephone Encounter (Signed)
Pt made aware rx available at front desk for pick up.   

## 2017-01-23 ENCOUNTER — Encounter (HOSPITAL_BASED_OUTPATIENT_CLINIC_OR_DEPARTMENT_OTHER): Payer: Self-pay | Admitting: Emergency Medicine

## 2017-01-23 ENCOUNTER — Emergency Department (HOSPITAL_COMMUNITY): Payer: BLUE CROSS/BLUE SHIELD | Admitting: Registered Nurse

## 2017-01-23 ENCOUNTER — Emergency Department (HOSPITAL_BASED_OUTPATIENT_CLINIC_OR_DEPARTMENT_OTHER): Payer: BLUE CROSS/BLUE SHIELD

## 2017-01-23 ENCOUNTER — Encounter (HOSPITAL_COMMUNITY)
Admission: EM | Disposition: A | Discharge status: Home / Self Care | Payer: Self-pay | Source: Home / Self Care | Attending: Urology

## 2017-01-23 ENCOUNTER — Emergency Department (HOSPITAL_COMMUNITY): Payer: BLUE CROSS/BLUE SHIELD

## 2017-01-23 ENCOUNTER — Inpatient Hospital Stay (HOSPITAL_BASED_OUTPATIENT_CLINIC_OR_DEPARTMENT_OTHER)
Admission: EM | Admit: 2017-01-23 | Discharge: 2017-01-25 | DRG: 654 | Disposition: A | Discharge status: Home / Self Care | Payer: BLUE CROSS/BLUE SHIELD | Attending: Urology | Admitting: Urology

## 2017-01-23 DIAGNOSIS — X58XXXA Exposure to other specified factors, initial encounter: Secondary | ICD-10-CM | POA: Diagnosis not present

## 2017-01-23 DIAGNOSIS — T190XXA Foreign body in urethra, initial encounter: Secondary | ICD-10-CM | POA: Diagnosis not present

## 2017-01-23 DIAGNOSIS — S30851A Superficial foreign body of abdominal wall, initial encounter: Secondary | ICD-10-CM | POA: Diagnosis not present

## 2017-01-23 DIAGNOSIS — D72829 Elevated white blood cell count, unspecified: Secondary | ICD-10-CM

## 2017-01-23 DIAGNOSIS — Z800 Family history of malignant neoplasm of digestive organs: Secondary | ICD-10-CM

## 2017-01-23 DIAGNOSIS — R319 Hematuria, unspecified: Secondary | ICD-10-CM | POA: Diagnosis present

## 2017-01-23 DIAGNOSIS — F909 Attention-deficit hyperactivity disorder, unspecified type: Secondary | ICD-10-CM | POA: Diagnosis present

## 2017-01-23 DIAGNOSIS — T191XXA Foreign body in bladder, initial encounter: Secondary | ICD-10-CM | POA: Diagnosis present

## 2017-01-23 DIAGNOSIS — N3289 Other specified disorders of bladder: Secondary | ICD-10-CM | POA: Diagnosis present

## 2017-01-23 DIAGNOSIS — Z801 Family history of malignant neoplasm of trachea, bronchus and lung: Secondary | ICD-10-CM

## 2017-01-23 DIAGNOSIS — N390 Urinary tract infection, site not specified: Secondary | ICD-10-CM | POA: Diagnosis not present

## 2017-01-23 DIAGNOSIS — E669 Obesity, unspecified: Secondary | ICD-10-CM | POA: Diagnosis not present

## 2017-01-23 DIAGNOSIS — S3729XA Other injury of bladder, initial encounter: Secondary | ICD-10-CM

## 2017-01-23 DIAGNOSIS — Z803 Family history of malignant neoplasm of breast: Secondary | ICD-10-CM | POA: Diagnosis not present

## 2017-01-23 DIAGNOSIS — IMO0002 Reserved for concepts with insufficient information to code with codable children: Secondary | ICD-10-CM

## 2017-01-23 DIAGNOSIS — J329 Chronic sinusitis, unspecified: Secondary | ICD-10-CM | POA: Diagnosis not present

## 2017-01-23 DIAGNOSIS — S3723XA Laceration of bladder, initial encounter: Secondary | ICD-10-CM | POA: Diagnosis not present

## 2017-01-23 DIAGNOSIS — Z6839 Body mass index (BMI) 39.0-39.9, adult: Secondary | ICD-10-CM | POA: Diagnosis not present

## 2017-01-23 HISTORY — PX: FOREIGN BODY RETRIEVAL: CATH118241

## 2017-01-23 HISTORY — PX: BLADDER NECK RECONSTRUCTION: SHX1239

## 2017-01-23 HISTORY — PX: LAPAROSCOPIC ABDOMINAL EXPLORATION: SHX6249

## 2017-01-23 LAB — CBC WITH DIFFERENTIAL/PLATELET
BASOS ABS: 0 10*3/uL (ref 0.0–0.1)
Basophils Relative: 0 %
Eosinophils Absolute: 0.1 10*3/uL (ref 0.0–0.7)
Eosinophils Relative: 1 %
HEMATOCRIT: 43.6 % (ref 36.0–46.0)
Hemoglobin: 14.7 g/dL (ref 12.0–15.0)
LYMPHS ABS: 3.3 10*3/uL (ref 0.7–4.0)
LYMPHS PCT: 23 %
MCH: 29.2 pg (ref 26.0–34.0)
MCHC: 33.7 g/dL (ref 30.0–36.0)
MCV: 86.5 fL (ref 78.0–100.0)
MONO ABS: 0.7 10*3/uL (ref 0.1–1.0)
Monocytes Relative: 5 %
NEUTROS ABS: 10.5 10*3/uL — AB (ref 1.7–7.7)
Neutrophils Relative %: 71 %
Platelets: 315 10*3/uL (ref 150–400)
RBC: 5.04 MIL/uL (ref 3.87–5.11)
RDW: 14 % (ref 11.5–15.5)
WBC: 14.7 10*3/uL — ABNORMAL HIGH (ref 4.0–10.5)

## 2017-01-23 LAB — COMPREHENSIVE METABOLIC PANEL
ALT: 19 U/L (ref 14–54)
AST: 23 U/L (ref 15–41)
Albumin: 4.1 g/dL (ref 3.5–5.0)
Alkaline Phosphatase: 84 U/L (ref 38–126)
Anion gap: 10 (ref 5–15)
BILIRUBIN TOTAL: 0.4 mg/dL (ref 0.3–1.2)
BUN: 11 mg/dL (ref 6–20)
CO2: 24 mmol/L (ref 22–32)
CREATININE: 1.01 mg/dL — AB (ref 0.44–1.00)
Calcium: 8.9 mg/dL (ref 8.9–10.3)
Chloride: 109 mmol/L (ref 101–111)
GFR calc non Af Amer: >60 mL/min (ref >60)
Glucose, Bld: 105 mg/dL — ABNORMAL HIGH (ref 65–99)
Potassium: 3.8 mmol/L (ref 3.5–5.1)
Sodium: 143 mmol/L (ref 135–145)
TOTAL PROTEIN: 7.8 g/dL (ref 6.5–8.1)

## 2017-01-23 LAB — PREGNANCY, URINE: Preg Test, Ur: NEGATIVE

## 2017-01-23 SURGERY — CYSTOURETHROPLASTY
Anesthesia: General

## 2017-01-23 MED ORDER — SUFENTANIL CITRATE 50 MCG/ML IV SOLN
INTRAVENOUS | Status: DC | PRN
  Administered 2017-01-23: 5 ug via INTRAVENOUS
  Administered 2017-01-23 (×3): 10 ug via INTRAVENOUS
  Administered 2017-01-23 (×3): 5 ug via INTRAVENOUS

## 2017-01-23 MED ORDER — HYDROMORPHONE HCL 1 MG/ML IJ SOLN
INTRAMUSCULAR | Status: DC | PRN
  Administered 2017-01-23 (×2): 1 mg via INTRAVENOUS

## 2017-01-23 MED ORDER — MIDAZOLAM HCL 2 MG/2ML IJ SOLN
INTRAMUSCULAR | Status: AC
  Filled 2017-01-23: qty 2

## 2017-01-23 MED ORDER — HYDROMORPHONE HCL-NACL 0.5-0.9 MG/ML-% IV SOSY
0.2500 mg | PREFILLED_SYRINGE | INTRAVENOUS | Status: DC | PRN
  Administered 2017-01-24 (×4): 0.5 mg via INTRAVENOUS

## 2017-01-23 MED ORDER — INDIGOTINDISULFONATE SODIUM 8 MG/ML IJ SOLN
INTRAMUSCULAR | Status: AC
  Filled 2017-01-23: qty 5

## 2017-01-23 MED ORDER — SODIUM CHLORIDE 0.9 % IJ SOLN
INTRAMUSCULAR | Status: AC
  Filled 2017-01-23: qty 10

## 2017-01-23 MED ORDER — OXYCODONE HCL 5 MG/5ML PO SOLN
5.0000 mg | Freq: Once | ORAL | Status: DC | PRN
  Filled 2017-01-23: qty 5

## 2017-01-23 MED ORDER — STERILE WATER FOR IRRIGATION IR SOLN
Status: DC | PRN
  Administered 2017-01-23: 1000 mL

## 2017-01-23 MED ORDER — PROPOFOL 10 MG/ML IV BOLUS
INTRAVENOUS | Status: AC
  Filled 2017-01-23: qty 20

## 2017-01-23 MED ORDER — SODIUM CHLORIDE 0.9 % IR SOLN
Status: DC | PRN
  Administered 2017-01-23: 1000 mL

## 2017-01-23 MED ORDER — DEXAMETHASONE SODIUM PHOSPHATE 10 MG/ML IJ SOLN
INTRAMUSCULAR | Status: AC
  Filled 2017-01-23: qty 1

## 2017-01-23 MED ORDER — MORPHINE SULFATE (PF) 4 MG/ML IV SOLN
4.0000 mg | Freq: Once | INTRAVENOUS | Status: AC
  Administered 2017-01-23: 4 mg via INTRAVENOUS
  Filled 2017-01-23: qty 1

## 2017-01-23 MED ORDER — PROPOFOL 10 MG/ML IV BOLUS
INTRAVENOUS | Status: AC
  Filled 2017-01-23: qty 40

## 2017-01-23 MED ORDER — SUFENTANIL CITRATE 50 MCG/ML IV SOLN
INTRAVENOUS | Status: AC
Start: 2017-01-23 — End: ?
  Filled 2017-01-23: qty 1

## 2017-01-23 MED ORDER — OXYCODONE HCL 5 MG PO TABS: 5.0000 mg | ORAL_TABLET | Freq: Once | ORAL | Status: DC | PRN

## 2017-01-23 MED ORDER — PROPOFOL 500 MG/50ML IV EMUL
INTRAVENOUS | Status: DC | PRN
  Administered 2017-01-23: 100 ug/kg/min via INTRAVENOUS

## 2017-01-23 MED ORDER — ONDANSETRON HCL 4 MG/2ML IJ SOLN
INTRAMUSCULAR | Status: AC
  Filled 2017-01-23: qty 2

## 2017-01-23 MED ORDER — DEXAMETHASONE SODIUM PHOSPHATE 10 MG/ML IJ SOLN
INTRAMUSCULAR | Status: DC | PRN
  Administered 2017-01-23: 10 mg via INTRAVENOUS

## 2017-01-23 MED ORDER — SUCCINYLCHOLINE CHLORIDE 20 MG/ML IJ SOLN
INTRAMUSCULAR | Status: DC | PRN
  Administered 2017-01-23: 140 mg via INTRAVENOUS

## 2017-01-23 MED ORDER — LACTATED RINGERS IV SOLN
INTRAVENOUS | Status: DC | PRN
  Administered 2017-01-23 (×2): via INTRAVENOUS

## 2017-01-23 MED ORDER — MIDAZOLAM HCL 5 MG/5ML IJ SOLN
INTRAMUSCULAR | Status: DC | PRN
  Administered 2017-01-23: 2 mg via INTRAVENOUS

## 2017-01-23 MED ORDER — ONDANSETRON HCL 4 MG/2ML IJ SOLN
INTRAMUSCULAR | Status: DC | PRN
  Administered 2017-01-23: 4 mg via INTRAVENOUS

## 2017-01-23 MED ORDER — LIDOCAINE HCL (CARDIAC) 20 MG/ML IV SOLN
INTRAVENOUS | Status: DC | PRN
  Administered 2017-01-23: 100 mg via INTRAVENOUS
  Administered 2017-01-23: 25 mg via INTRAVENOUS

## 2017-01-23 MED ORDER — HYDROMORPHONE HCL 2 MG/ML IJ SOLN
INTRAMUSCULAR | Status: AC
  Filled 2017-01-23: qty 1

## 2017-01-23 MED ORDER — PIPERACILLIN-TAZOBACTAM 3.375 G IVPB
INTRAVENOUS | Status: AC
  Filled 2017-01-23: qty 50

## 2017-01-23 MED ORDER — ACETAMINOPHEN 10 MG/ML IV SOLN
INTRAVENOUS | Status: DC | PRN
  Administered 2017-01-23: 1000 mg via INTRAVENOUS

## 2017-01-23 MED ORDER — SUGAMMADEX SODIUM 200 MG/2ML IV SOLN
INTRAVENOUS | Status: DC | PRN
  Administered 2017-01-23: 200 mg via INTRAVENOUS

## 2017-01-23 MED ORDER — ACETAMINOPHEN 10 MG/ML IV SOLN
INTRAVENOUS | Status: AC
  Filled 2017-01-23: qty 100

## 2017-01-23 MED ORDER — PROMETHAZINE HCL 25 MG/ML IJ SOLN: 6.2500 mg | INTRAMUSCULAR | Status: DC | PRN

## 2017-01-23 MED ORDER — PIPERACILLIN-TAZOBACTAM 3.375 G IVPB 30 MIN
3.3750 g | Freq: Once | INTRAVENOUS | Status: AC
  Administered 2017-01-23: 3.375 g via INTRAVENOUS
  Filled 2017-01-23: qty 50

## 2017-01-23 MED ORDER — PROPOFOL 10 MG/ML IV BOLUS
INTRAVENOUS | Status: DC | PRN
  Administered 2017-01-23: 260 mg via INTRAVENOUS

## 2017-01-23 MED ORDER — ROCURONIUM BROMIDE 100 MG/10ML IV SOLN
INTRAVENOUS | Status: DC | PRN
  Administered 2017-01-23: 10 mg via INTRAVENOUS
  Administered 2017-01-23: 50 mg via INTRAVENOUS

## 2017-01-23 MED ORDER — MEPERIDINE HCL 50 MG/ML IJ SOLN: 6.2500 mg | INTRAMUSCULAR | Status: DC | PRN

## 2017-01-23 SURGICAL SUPPLY — 81 items
BAG LAPAROSCOPIC 12 15 PORT 16 (BASKET) IMPLANT
BAG RETRIEVAL 12/15 (BASKET)
BAG URINE DRAINAGE (UROLOGICAL SUPPLIES) ×3 IMPLANT
BAG URO CATCHER STRL LF (MISCELLANEOUS) ×3 IMPLANT
BAG ZIPLOCK 12X15 (MISCELLANEOUS) ×3 IMPLANT
BLADE EXTENDED COATED 6.5IN (ELECTRODE) IMPLANT
BLADE SURG SZ10 CARB STEEL (BLADE) ×3 IMPLANT
CABLE HIGH FREQUENCY MONO STRZ (ELECTRODE) ×3 IMPLANT
CATH FOLEY 2WAY SLVR 18FR 30CC (CATHETERS) ×3 IMPLANT
CHLORAPREP W/TINT 26ML (MISCELLANEOUS) ×3 IMPLANT
CLIP VESOLOCK LG 6/CT PURPLE (CLIP) IMPLANT
CLIP VESOLOCK MED LG 6/CT (CLIP) IMPLANT
CLIP VESOLOCK XL 6/CT (CLIP) IMPLANT
COVER SURGICAL LIGHT HANDLE (MISCELLANEOUS) ×3 IMPLANT
DERMABOND ADVANCED (GAUZE/BANDAGES/DRESSINGS) ×1
DERMABOND ADVANCED .7 DNX12 (GAUZE/BANDAGES/DRESSINGS) ×2 IMPLANT
DEVICE TROCAR PUNCTURE CLOSURE (ENDOMECHANICALS) ×3 IMPLANT
DISSECT BALLN SPACEMKR + OVL (BALLOONS)
DISSECTOR BALLN SPACEMKR + OVL (BALLOONS) IMPLANT
DRAIN CHANNEL 10F 3/8 F FF (DRAIN) IMPLANT
DRAIN CHANNEL 19F RND (DRAIN) ×3 IMPLANT
DRAPE INCISE IOBAN 66X45 STRL (DRAPES) ×6 IMPLANT
DRAPE LAPAROSCOPIC ABDOMINAL (DRAPES) IMPLANT
DRAPE WARM FLUID 44X44 (DRAPE) ×3 IMPLANT
DRSG TEGADERM 4X4.75 (GAUZE/BANDAGES/DRESSINGS) IMPLANT
ELECT PENCIL ROCKER SW 15FT (MISCELLANEOUS) ×3 IMPLANT
ELECT REM PT RETURN 15FT ADLT (MISCELLANEOUS) ×3 IMPLANT
EVACUATOR MICROVAS BLADDER (UROLOGICAL SUPPLIES) IMPLANT
EVACUATOR SILICONE 100CC (DRAIN) ×3 IMPLANT
GAUZE SPONGE 4X4 12PLY STRL (GAUZE/BANDAGES/DRESSINGS) ×3 IMPLANT
GLOVE BIOGEL M 8.0 STRL (GLOVE) ×3 IMPLANT
GLOVE BIOGEL M STRL SZ7.5 (GLOVE) ×3 IMPLANT
GOWN STRL REUS W/TWL LRG LVL3 (GOWN DISPOSABLE) ×6 IMPLANT
GOWN STRL REUS W/TWL XL LVL3 (GOWN DISPOSABLE) ×6 IMPLANT
GRASPER SUT TROCAR 14GX15 (MISCELLANEOUS) ×3 IMPLANT
HEMOSTAT SURGICEL 4X8 (HEMOSTASIS) IMPLANT
IRRIG SUCT STRYKERFLOW 2 WTIP (MISCELLANEOUS) ×3
IRRIGATION SUCT STRKRFLW 2 WTP (MISCELLANEOUS) ×2 IMPLANT
KIT BASIN OR (CUSTOM PROCEDURE TRAY) ×3 IMPLANT
MANIFOLD NEPTUNE II (INSTRUMENTS) ×6 IMPLANT
PACK CYSTO (CUSTOM PROCEDURE TRAY) ×3 IMPLANT
PAD POSITIONING PINK XL (MISCELLANEOUS) IMPLANT
POSITIONER SURGICAL ARM (MISCELLANEOUS) ×6 IMPLANT
RELOAD STAPLER WHITE 60MM (STAPLE) IMPLANT
RETRACTOR LAPSCP 12X46 CVD (ENDOMECHANICALS) IMPLANT
RTRCTR LAPSCP 12X46 CVD (ENDOMECHANICALS)
SCISSORS LAP 5X35 DISP (ENDOMECHANICALS) ×3 IMPLANT
SET ASPIRATION TUBING (TUBING) IMPLANT
SHEARS HARMONIC ACE PLUS 36CM (ENDOMECHANICALS) IMPLANT
SOLUTION ANTI FOG 6CC (MISCELLANEOUS) IMPLANT
SPONGE LAP 18X18 X RAY DECT (DISPOSABLE) IMPLANT
SPONGE LAP 4X18 X RAY DECT (DISPOSABLE) IMPLANT
SPONGE SURGIFOAM ABS GEL 100 (HEMOSTASIS) IMPLANT
STAPLE ECHEON FLEX 60 POW ENDO (STAPLE) IMPLANT
STAPLER RELOAD WHITE 60MM (STAPLE)
SURGIFLO W/THROMBIN 8M KIT (HEMOSTASIS) IMPLANT
SUT ETHILON 2 0 PS N (SUTURE) ×3 IMPLANT
SUT ETHILON 3 0 PS 1 (SUTURE) ×6 IMPLANT
SUT MNCRL AB 4-0 PS2 18 (SUTURE) ×3 IMPLANT
SUT PDS AB 1 CTX 36 (SUTURE) ×6 IMPLANT
SUT V-LOC BARB 180 2/0GR6 GS22 (SUTURE) ×3
SUT VIC AB 2-0 SH 27 (SUTURE) ×1
SUT VIC AB 2-0 SH 27X BRD (SUTURE) ×2 IMPLANT
SUT VICRYL 0 UR6 27IN ABS (SUTURE) ×12 IMPLANT
SUT VLOC BARB 180 ABS3/0GR12 (SUTURE) ×3
SUTURE V-LC BRB 180 2/0GR6GS22 (SUTURE) ×2 IMPLANT
SUTURE VLOC BRB 180 ABS3/0GR12 (SUTURE) ×2 IMPLANT
SYRINGE IRR TOOMEY STRL 70CC (SYRINGE) ×3 IMPLANT
TAPE CLOTH 4X10 WHT NS (GAUZE/BANDAGES/DRESSINGS) IMPLANT
TAPE STRIPS DRAPE STRL (GAUZE/BANDAGES/DRESSINGS) ×3 IMPLANT
TOWEL OR 17X26 10 PK STRL BLUE (TOWEL DISPOSABLE) ×3 IMPLANT
TOWEL OR NON WOVEN STRL DISP B (DISPOSABLE) ×3 IMPLANT
TRAY FOLEY W/METER SILVER 16FR (SET/KITS/TRAYS/PACK) IMPLANT
TRAY LAPAROSCOPIC (CUSTOM PROCEDURE TRAY) ×3 IMPLANT
TROCAR BLADELESS OPT 5 100 (ENDOMECHANICALS) ×3 IMPLANT
TROCAR UNIVERSAL OPT 12M 100M (ENDOMECHANICALS) ×6 IMPLANT
TROCAR XCEL 12X100 BLDLESS (ENDOMECHANICALS) ×3 IMPLANT
TROCAR XCEL NON-BLD 11X100MML (ENDOMECHANICALS) ×3 IMPLANT
TUBING CONNECTING 10 (TUBING) IMPLANT
TUBING INSUF HEATED (TUBING) ×3 IMPLANT
YANKAUER SUCT BULB TIP 10FT TU (MISCELLANEOUS) IMPLANT
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
## 2017-01-23 NOTE — Brief Op Note (Signed)
01/23/2017  11:50 PM  PATIENT:  Crystal Clements  26 y.o. female  PRE-OPERATIVE DIAGNOSIS:  Ruptured bladder, Abdominal foreign body  POST-OPERATIVE DIAGNOSIS:  * No post-op diagnosis entered *  PROCEDURE:  Procedure(s): BLADDER REPAIR (N/A) LAPAROSCOPIC ABDOMINAL EXPLORATION FOREIGN BODY RETRIEVAL (N/A)  SURGEON:  Surgeon(s) and Role:    * MacDiarmid, Lorin Picket, MD - Primary    * Sebastian Ache, MD - Assisting  PHYSICIAN ASSISTANT:   ASSISTANTS: Alfredo Martinez MD   ANESTHESIA:   general  EBL:  Total I/O In: 1000 [I.V.:1000] Out: 125 [Urine:100; Blood:25]  BLOOD ADMINISTERED:none  DRAINS: 1 - foley to gravity. 2 - JP to bulb   LOCAL MEDICATIONS USED:  NONE  SPECIMEN:  Source of Specimen:  abdominal - bladder foriegn body (make up brush)  DISPOSITION OF SPECIMEN:  PATHOLOGY  COUNTS:  YES  TOURNIQUET:  * No tourniquets in log *  DICTATION: .Other Dictation: Dictation Number [id number]  PLAN OF CARE: Admit to inpatient   PATIENT DISPOSITION:  PACU - hemodynamically stable.   Delay start of Pharmacological VTE agent (>24hrs) due to surgical blood loss or risk of bleeding: yes

## 2017-01-23 NOTE — Anesthesia Preprocedure Evaluation (Signed)
Anesthesia Evaluation  Patient identified by MRN, date of birth, ID band Patient awake    Reviewed: Allergy & Precautions, NPO status , Patient's Chart, lab work & pertinent test results  Airway Mallampati: II  TM Distance: >3 FB Neck ROM: Full    Dental no notable dental hx.    Pulmonary neg pulmonary ROS,    Pulmonary exam normal breath sounds clear to auscultation       Cardiovascular negative cardio ROS Normal cardiovascular exam Rhythm:Regular Rate:Normal     Neuro/Psych negative neurological ROS  negative psych ROS   GI/Hepatic negative GI ROS, Neg liver ROS,   Endo/Other  negative endocrine ROS  Renal/GU negative Renal ROS  negative genitourinary   Musculoskeletal negative musculoskeletal ROS (+)   Abdominal (+) + obese,   Peds negative pediatric ROS (+)  Hematology negative hematology ROS (+)   Anesthesia Other Findings ADD  Reproductive/Obstetrics negative OB ROS                             Anesthesia Physical Anesthesia Plan  ASA: II and emergent  Anesthesia Plan: General   Post-op Pain Management:    Induction: Intravenous  PONV Risk Score and Plan: 3 and Ondansetron, Dexamethasone and Midazolam  Airway Management Planned: Oral ETT  Additional Equipment:   Intra-op Plan:   Post-operative Plan: Extubation in OR  Informed Consent: I have reviewed the patients History and Physical, chart, labs and discussed the procedure including the risks, benefits and alternatives for the proposed anesthesia with the patient or authorized representative who has indicated his/her understanding and acceptance.   Dental advisory given  Plan Discussed with: CRNA  Anesthesia Plan Comments:         Anesthesia Quick Evaluation

## 2017-01-23 NOTE — ED Provider Notes (Signed)
Care assumed upon transfer from [facility], pt seen there by Dietrich Pates PA-C, please see their notes for full documentation of patient's complaint/HPI. Briefly, pt here with retained make up brush in her urethra. GU Exam by Dietrich Pates PA-C did not reveal retained FB in urethra or vagina.  Results so far: Upreg neg, Abd xray 1 view showing parallel linear opacities overlying lower pelvis, which could indicate FB in urinary bladder.  Provider at [facility] consulted urology (Dr. Lissa Hoard), who recommended xfer here for CT and urology eval.   Additional information gathered upon patient arrival: Pt reports she last had a sip of water at 4:30pm, and her last meal was around 11am today. NKDA. Denies having any medical conditions or being on any medications. No prior surgeries. C/o suprapubic abd pain which worsens with sitting down. Has still been able to pee very small amounts, noticed trace blood in urine; has a constant urge to urinate. Denies n/v, vaginal complaints, or other complaints at this time.   Physical Exam  BP (!) 151/110 (BP Location: Left Arm)   Pulse (!) 125   Temp 98.1 F (36.7 C) (Oral)   Resp 18   Ht  (1.702 m)   Wt 113.4 kg (250 lb)   LMP 11/24/2016   SpO2 94%   BMI 39.16 kg/m    Physical Exam Gen: afebrile, appears uncomfortable, doesn't want to sit down HEENT: EOMI, MMM Resp: no resp distress CV: mildly tachycardic, likely from pain Abd: Soft, obese but not obviously distended, +BS throughout, with moderate suprapubic TTP, no r/g/r, neg murphy's, neg mcburney's, no CVA TTP  MsK: moving all extremities with ease Neuro: A&O x4  ED Course  Procedures Results for orders placed or performed during the hospital encounter of 01/23/17  Pregnancy, urine  Result Value Ref Range   Preg Test, Ur NEGATIVE NEGATIVE  CBC with Differential  Result Value Ref Range   WBC 14.7 (H) 4.0 - 10.5 K/uL   RBC 5.04 3.87 - 5.11 MIL/uL   Hemoglobin 14.7 12.0 - 15.0 g/dL   HCT 40.9 81.1 -  91.4 %   MCV 86.5 78.0 - 100.0 fL   MCH 29.2 26.0 - 34.0 pg   MCHC 33.7 30.0 - 36.0 g/dL   RDW 78.2 95.6 - 21.3 %   Platelets 315 150 - 400 K/uL   Neutrophils Relative % 71 %   Neutro Abs 10.5 (H) 1.7 - 7.7 K/uL   Lymphocytes Relative 23 %   Lymphs Abs 3.3 0.7 - 4.0 K/uL   Monocytes Relative 5 %   Monocytes Absolute 0.7 0.1 - 1.0 K/uL   Eosinophils Relative 1 %   Eosinophils Absolute 0.1 0.0 - 0.7 K/uL   Basophils Relative 0 %   Basophils Absolute 0.0 0.0 - 0.1 K/uL   Dg Abdomen 1 View  Result Date: 01/23/2017 CLINICAL DATA:  Foreign object placed in urethra. EXAM: ABDOMEN - 1 VIEW COMPARISON:  None. FINDINGS: There are two parallel linear opacities overlying the lower pelvis. Bowel gas pattern is normal. No other abnormality. IMPRESSION: Two parallel all linear opacities overlying the lower pelvis. This could indicate a foreign body within the urinary bladder. CT or cystoscopy may be helpful. Electronically Signed   By: Deatra Robinson M.D.   On: 01/23/2017 17:51   Ct Pelvis Wo Contrast  Result Date: 01/23/2017 CLINICAL DATA:  Possible foreign body. EXAM: CT PELVIS WITHOUT CONTRAST TECHNIQUE: Multidetector CT imaging of the pelvis was performed following the standard protocol without intravenous contrast. COMPARISON:  None. FINDINGS: Urinary Tract: There is a approximately 12 cm long air-filled linear foreign body that extends from the urinary bladder through the superior portion of the bladder wall into the peritoneal space anteriorly. This is concerning for bladder rupture. Bowel:  Unremarkable visualized pelvic bowel loops. Vascular/Lymphatic: No pathologically enlarged lymph nodes. No significant vascular abnormality seen. Reproductive:  No mass or other significant abnormality Other:  None. Musculoskeletal: No suspicious bone lesions identified. IMPRESSION: Approximately 12 cm long foreign body is seen extending from urinary bladder lumen and through superior bladder wall into anterior  peritoneal space, resulting in bladder perforation or rupture. Urologic consultation is recommended. Critical Value/emergent results were called by telephone at the time of interpretation on 01/23/2017 at 8:46 pm to Dr. Rhona Raider , who verbally acknowledged these results. Electronically Signed   By: Lupita Raider, M.D.   On: 01/23/2017 20:47     Meds ordered this encounter  Medications  . morphine 4 MG/ML injection 4 mg  . OVER THE COUNTER MEDICATION    Sig: Take 2 tablets by mouth 2 (two) times daily. Hydroxycut     MDM:   ICD-10-CM   1. Foreign body in urethra, initial encounter T19.0XXA   2. Traumatic rupture of bladder, initial encounter S37.29XA   3. Perforation of bladder N32.89   4. Leukocytosis, unspecified type D72.829     7:37 PM- pt arrived. Ordered for CT renal study. Will get Istat chem 8 to check lytes just in case she needs to go to operating room. Will give pain meds and reassess after CT results return.   8:50 PM CT changed to CT pelvis w/o contrast to save pt radiation, at radiology technicians suggestion; results confirm 12cm long FB from urinary bladder through superior bladder wall into anterior peritoneal space resulting in bladder perf/rupture. Changed labs to formal CBC w/diff and CMP, which haven't been done yet (chem 8 also hadn't been done before I changed the order); having nursing staff get this NOW, and give her pain meds ASAP. Consulting urology to inform of this information.   8:54 PM Dr. Lissa Hoard of urology returning page, is on his way now to come evaluate the pt. Will continue to monitor while pt is here, and await Dr. Denese Killings arrival.   9:35 PM CBC w/diff with mild leukocytosis likely from stress response. CMP pending. Dr. Lissa Hoard down to see pt and will admit and take to the OR. Holding orders to be placed by admitting team. Please see their notes for further documentation of care. I appreciate their help with this pleasant pt's care.  Pt stable at time of admission.     CRITICAL CARE-perforated viscus Performed by: Rhona Raider   Total critical care time: 35 minutes  Critical care time was exclusive of separately billable procedures and treating other patients.  Critical care was necessary to treat or prevent imminent or life-threatening deterioration.  Critical care was time spent personally by me on the following activities: development of treatment plan with patient and/or surrogate as well as nursing, discussions with consultants, evaluation of patient's response to treatment, examination of patient, obtaining history from patient or surrogate, ordering and performing treatments and interventions, ordering and review of laboratory studies, ordering and review of radiographic studies, pulse oximetry and re-evaluation of patient's condition.      [address] [place], [place] 01/23/17 2136

## 2017-01-23 NOTE — ED Notes (Signed)
ED Provider at bedside. 

## 2017-01-23 NOTE — ED Notes (Addendum)
Urologist at bedside (Dr. Lorin Picket MacDiamid).
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
## 2017-01-23 NOTE — ED Notes (Signed)
Pelvic cart at bed side set up

## 2017-01-23 NOTE — ED Notes (Signed)
Bed: WA11 Expected date:  Expected time:  Means of arrival:  Comments: 

## 2017-01-23 NOTE — Anesthesia Procedure Notes (Signed)
Procedure Name: Intubation Date/Time: 01/23/2017 10:19 PM Performed by: Lissa Morales Pre-anesthesia Checklist: Patient identified, Emergency Drugs available, Suction available and Patient being monitored Patient Re-evaluated:Patient Re-evaluated prior to induction Oxygen Delivery Method: Circle system utilized Preoxygenation: Pre-oxygenation with 100% oxygen Induction Type: IV induction, Cricoid Pressure applied and Rapid sequence Laryngoscope Size: Mac and 4 Grade View: Grade II Tube type: Oral Tube size: 7.5 mm Number of attempts: 1 Airway Equipment and Method: Stylet and Oral airway Placement Confirmation: ETT inserted through vocal cords under direct vision,  positive ETCO2 and breath sounds checked- equal and bilateral Secured at: 22 cm Tube secured with: Tape Dental Injury: Teeth and Oropharynx as per pre-operative assessment

## 2017-01-23 NOTE — Consult Note (Signed)
Urology Consult  Referring physician: Theodoro Grist Reason for referral: Foreign body in bladder with perforation  Chief Complaint: Foreign body in bladder with perforation  History of Present Illness: Patient placed foreign body in urethra as noted; makeup brush; inserted re 11 am; small volume voids and some blood; low abdominal pain fairly steady and worse with sit and possibly with void; one UTI years ago; no GU surgery or stones; no fever  Modifying factors: There are no other modifying factors  Associated signs and symptoms: There are no other associated signs and symptoms Aggravating and relieving factors: There are no other aggravating or relieving factors Severity: Moderate Duration: Persistent   CT scan: 12 cm linear foreign body in bladder into peritoneum with air  Past Medical History:  Diagnosis Date  . ADD (attention deficit disorder with hyperactivity)    History reviewed. No pertinent surgical history.  Medications: I have reviewed the patient's current medications. Allergies: No Known Allergies  Family History  Problem Relation Age of Onset  . Breast cancer Maternal Grandmother        60s  . Pancreatic cancer Maternal Grandmother   . Lung cancer Maternal Aunt        smoker   Social History:  reports that she has never smoked. She has never used smokeless tobacco. She reports that she drinks about 1.5 oz of alcohol per week . She reports that she does not use drugs.  ROS: All systems are reviewed and negative except as noted. Rest negative  Physical Exam:  Vital signs in last 24 hours: Temp:  [98.1 F (36.7 C)-99.4 F (37.4 C)] 98.1 F (36.7 C) (09/30 2007) Pulse Rate:  [125-140] 125 (09/30 2007) Resp:  [18-22] 18 (09/30 2007) BP: (148-162)/(99-110) 151/110 (09/30 2007) SpO2:  [94 %-100 %] 94 % (09/30 2007) Weight:  [113.4 kg (250 lb)] 113.4 kg (250 lb) (09/30 2007)  Cardiovascular: Skin warm; not flushed Respiratory: Breaths quiet; no shortness of  breath Abdomen: No masses Neurological: Normal sensation to touch Musculoskeletal: Normal motor function arms and legs Lymphatics: No inguinal adenopathy Skin: No rashes Genitourinary:benign obese abdomen; non-toxic  Laboratory Data:  Results for orders placed or performed during the hospital encounter of 01/23/17 (from the past 72 hour(s))  Pregnancy, urine     Status: None   Collection Time: 01/23/17  4:40 PM  Result Value Ref Range   Preg Test, Ur NEGATIVE NEGATIVE    Comment:        THE SENSITIVITY OF THIS METHODOLOGY IS >20 mIU/mL.    No results found for this or any previous visit (from the past 240 hour(s)). Creatinine: No results for input(s): CREATININE in the last 168 hours.  Xrays: See report/chart As noted  Impression/Assessment:  Bladder perforation with brush re 12 cm long and reported diameter of pen ;picture drawn; Dr. Berneice Heinrich to assist; detailed laparotomy/possible laparoscopic converted to open; check ureters and bowel with sequelae detailed; pros and cons and risks and sequelae including injury to bowel/ureter/bladder/urethra/vagina and sequelae; infection/blood/foley/cystogram/abscess short and long term; voiding dysfunction seriousness detailed: OR already notified and preparnig; antibiotics on call to OR  Plan:  As above; urgent surgery   Cristel Rail A 01/23/2017, 9:15 PM

## 2017-01-24 ENCOUNTER — Encounter (HOSPITAL_COMMUNITY): Payer: Self-pay | Admitting: Urology

## 2017-01-24 LAB — HIV ANTIBODY (ROUTINE TESTING W REFLEX): HIV Screen 4th Generation wRfx: NONREACTIVE

## 2017-01-24 MED ORDER — HYDROMORPHONE HCL-NACL 0.5-0.9 MG/ML-% IV SOSY
PREFILLED_SYRINGE | INTRAVENOUS | Status: AC
  Filled 2017-01-24: qty 1

## 2017-01-24 MED ORDER — CEFAZOLIN SODIUM-DEXTROSE 1-4 GM/50ML-% IV SOLN
1.0000 g | Freq: Three times a day (TID) | INTRAVENOUS | Status: DC
  Administered 2017-01-24 – 2017-01-25 (×4): 1 g via INTRAVENOUS
  Filled 2017-01-24 (×9): qty 50

## 2017-01-24 MED ORDER — OXYBUTYNIN CHLORIDE 5 MG PO TABS
5.0000 mg | ORAL_TABLET | Freq: Three times a day (TID) | ORAL | Status: DC | PRN
  Administered 2017-01-24 – 2017-01-25 (×3): 5 mg via ORAL
  Filled 2017-01-24 (×3): qty 1

## 2017-01-24 MED ORDER — DIPHENHYDRAMINE HCL 50 MG/ML IJ SOLN
12.5000 mg | Freq: Once | INTRAMUSCULAR | Status: AC
  Administered 2017-01-24: 12.5 mg via INTRAVENOUS

## 2017-01-24 MED ORDER — HYDROCODONE-ACETAMINOPHEN 5-325 MG PO TABS
1.0000 | ORAL_TABLET | ORAL | Status: DC | PRN
  Administered 2017-01-24 (×2): 2 via ORAL
  Administered 2017-01-24: 1 via ORAL
  Administered 2017-01-25: 2 via ORAL
  Filled 2017-01-24: qty 2
  Filled 2017-01-24: qty 1
  Filled 2017-01-24 (×2): qty 2

## 2017-01-24 MED ORDER — ONDANSETRON HCL 4 MG/2ML IJ SOLN: 4.0000 mg | INTRAMUSCULAR | Status: DC | PRN

## 2017-01-24 MED ORDER — ACETAMINOPHEN 325 MG PO TABS: 650.0000 mg | ORAL_TABLET | ORAL | Status: DC | PRN

## 2017-01-24 MED ORDER — FENTANYL CITRATE (PF) 100 MCG/2ML IJ SOLN
INTRAMUSCULAR | Status: DC | PRN
  Administered 2017-01-23: 50 ug via INTRAVENOUS
  Administered 2017-01-24: 100 ug via INTRAVENOUS

## 2017-01-24 MED ORDER — DIPHENHYDRAMINE HCL 50 MG/ML IJ SOLN
INTRAMUSCULAR | Status: AC
  Filled 2017-01-24: qty 1

## 2017-01-24 MED ORDER — FENTANYL CITRATE (PF) 250 MCG/5ML IJ SOLN
INTRAMUSCULAR | Status: AC
  Filled 2017-01-24: qty 5

## 2017-01-24 MED ORDER — LACTATED RINGERS IR SOLN
Status: DC | PRN
  Administered 2017-01-24: 1000 mL

## 2017-01-24 MED ORDER — CIPROFLOXACIN IN D5W 400 MG/200ML IV SOLN
400.0000 mg | Freq: Two times a day (BID) | INTRAVENOUS | Status: DC
  Administered 2017-01-24 (×3): 400 mg via INTRAVENOUS
  Filled 2017-01-24 (×3): qty 200

## 2017-01-24 MED ORDER — KCL IN DEXTROSE-NACL 20-5-0.45 MEQ/L-%-% IV SOLN
INTRAVENOUS | Status: DC
  Administered 2017-01-24: 125 mL/h via INTRAVENOUS
  Administered 2017-01-24: 02:00:00 via INTRAVENOUS
  Filled 2017-01-24 (×4): qty 1000

## 2017-01-24 MED ORDER — MORPHINE SULFATE (PF) 2 MG/ML IV SOLN
2.0000 mg | INTRAVENOUS | Status: DC | PRN
  Administered 2017-01-24 (×2): 2 mg via INTRAVENOUS
  Administered 2017-01-24: 4 mg via INTRAVENOUS
  Filled 2017-01-24 (×2): qty 1
  Filled 2017-01-24: qty 2

## 2017-01-24 NOTE — Progress Notes (Signed)
Looks very good Mild pain around drain today Belly benign Urine very clear Reviewed labs Drain 60 Follow

## 2017-01-24 NOTE — Op Note (Signed)
Crystal Clements, Crystal Clements              ACCOUNT NO.:  [id number]  MEDICAL RECORD NO.:  [id number]  LOCATION:  [facility]                         FACILITY:  [facility]  PHYSICIAN:  Sebastian Ache, MD     DATE OF BIRTH:  09-14-1990  DATE OF PROCEDURE:  01/23/2017                              OPERATIVE REPORT   DIAGNOSIS:  Bladder injury with urethral and abdominal foreign body.  PROCEDURES: 1. Diagnostic laparoscopy. 2. Intraurethral abdominal foreign body. 3. Repair of bladder rupture, laparoscopic.  ESTIMATED BLOOD LOSS:  Nil.  COMPLICATIONS:  None.  SPECIMENS:  Abdominal bladder foreign body for gross only.  This appears to be a makeup brush as described by the patient.  DRAINS: 1. Foley catheter straight drain. 2. Jackson-Pratt drain to bulb suction.  FINDINGS: 1. Bladder perforation at the dome of the bladder with a foreign body     just within the space of Retzius. 2. Excellent closure of bladder, watertight fashion following     cystotomy repair.  ASSISTANTS:  Martina Sinner, MD.  INDICATIONS:  Crystal Clements is a 26 year old lady who presented to the emergency room at [facility] earlier today with complaints of urethral foreign body.  She had apparently placed, what she describes as a makeup brush per urethra into her bladder and it unintentionally became completely withdrawn into the bladder associated with severe pain.  She was transferred to [facility] Emergency Room for further evaluation, as there was no urologic care at her previous facility.  CT scan corroborated a very long foreign body with apparent puncture of the dome of her bladder and likely intraperitoneal placement of this foreign body.  It clearly felt that an urgent surgical exploration was warranted, laparoscopic versus open approach with goals of identifying the common injuries for treating foreign body and repairing bladder. Notably, the patient's injury is completely midline and the  superior dome.  PROCEDURE IN DETAIL:  The patient being Crystal Clements verified. Procedure being laparoscopic versus open foreign body retrieval, laparoscopy, bladder repair was confirmed.  Procedure was carried out. Time-out was performed.  Intravenous antibiotics were administered. General endotracheal anesthesia was introduced.  The patient placed into a medium lithotomy position.  Sterile field was created by prepping and draping the patient's vagina, introitus, and proximal thighs using iodine and her infra-xiphoid abdomen using chlorhexidine gluconate. Her arms were tucked to her side, carefully padded.  She was further fashioned to the operating table using 3-inch tape over foam padding across her supraxiphoid chest.  She was placed into a steep Trendelenburg positioning and orogastric tube was placed.  Next, a high- flow, low pressure pneumoperitoneum was obtained using Veress technique in the supraumbilical midline having passed the aspiration and drop test.  A 12 mm camera port was placed into this location.  Laparoscopic examination of peritoneal cavity revealed no significant adhesions. There was no obvious visceral injury or large fluid or succus within the abdomen.  There was immediately visible an elongated dark black foreign body emanating from the dome of the bladder.  This amazingly was just within the peritoneum of the space of Retzius, technically preperitoneal.  Next, an additional 12 mm port was placed approximately 4 fingerbreadths lateral to the  camera port on the left and an additional 4 fingerbreadths lateral on the right.  Laparoscopic scissors were used with tension on the foreign body to release it from the peritoneum overlying the bladder.  The foreign body was grasped on its long axis, removed, and set aside for gross identification only.  It was visibly intact.  The bladder was then filled and the cystotomy was clearly noted and this did communicate  intraperitoneally at this point, it was quite small, at approximately 1 cm in diameter.  Next, this was also noted to be at the dome of the bladder and completely midline well away from the trigone and ureters.  As such, primary repair was performed with the first layer of running 3-0 V-Loc suture reapproximating the mucosa of the bladder and seromuscular layers in a running fashion.  A second imbricating layer of running 2-0 V-Loc was used providing peritoneal apposition over this.  The bladder was then once again filled with 200 mL and it was completely watertight.  The abdomen was once again inspected.  There was no evidence of visceral injury.  All sponge and needle counts were correct.  The Endo Close apparatus was used to close the midline and left 12 mm port sites at the level of the fascia.  A closed suction drain was brought through the previous lateral port site in the area of the peritoneal cavity.  Insufflation was taken down.  Drain stitch was applied.  All skin sites were reapproximated with subcuticular Monocryl, followed by Dermabond, and the procedure was terminated.  The patient tolerated the procedure well. There were no immediate periprocedural complications.  The patient was taken to the postanesthesia care in stable condition.  Please note, Dr. Sherron Monday as an assistant was absolutely crucial for all portions of the procedures today, given the unusual and traumatic nature of this. He provided invaluable retraction, intraoperative suctioning, and assisted with bladder and catheter manipulation without which, this would not be possible.          ______________________________ Sebastian Ache, MD     TM/MEDQ  D:  01/23/2017  T:  01/24/2017  Job:  [id number]

## 2017-01-24 NOTE — Anesthesia Postprocedure Evaluation (Signed)
Anesthesia Post Note  Patient: Crystal Clements  Procedure(s) Performed: BLADDER REPAIR (N/A ) LAPAROSCOPIC ABDOMINAL EXPLORATION FOREIGN BODY RETRIEVAL (N/A )     Patient location during evaluation: PACU Anesthesia Type: General Level of consciousness: awake and alert Pain management: pain level controlled Vital Signs Assessment: post-procedure vital signs reviewed and stable Respiratory status: spontaneous breathing, nonlabored ventilation and respiratory function stable Cardiovascular status: blood pressure returned to baseline and stable Postop Assessment: no apparent nausea or vomiting Anesthetic complications: no    Last Vitals:  Vitals:   01/24/17 0015 01/24/17 0017  BP: 127/60 134/72  Pulse: 100 (!) 105  Resp: 11 (!) 22  Temp:    SpO2: 98% 98%    Last Pain:  Vitals:   01/24/17 0015  TempSrc:   PainSc: 5                  Lowella Curb

## 2017-01-24 NOTE — Transfer of Care (Signed)
Immediate Anesthesia Transfer of Care Note  Patient: Crystal Clements  Procedure(s) Performed: BLADDER REPAIR (N/A ) LAPAROSCOPIC ABDOMINAL EXPLORATION FOREIGN BODY RETRIEVAL (N/A )  Patient Location: PACU  Anesthesia Type:General  Level of Consciousness: awake, alert , oriented and patient cooperative  Airway & Oxygen Therapy: Patient Spontanous Breathing and Patient connected to face mask oxygen  Post-op Assessment: Report given to RN, Post -op Vital signs reviewed and stable and Patient moving all extremities X 4  Post vital signs: stable  Last Vitals:  Vitals:   01/23/17 2137 01/24/17 0000  BP: (!) 147/96 116/64  Pulse: (!) 108 (!) 104  Resp: 18 14  Temp:  37.2 C  SpO2: 98% 94%    Last Pain:  Vitals:   01/24/17 0000  TempSrc:   PainSc: 3          Complications: No apparent anesthesia complications

## 2017-01-25 MED ORDER — HYDROCODONE-ACETAMINOPHEN 5-325 MG PO TABS: 1.0000 | ORAL_TABLET | Freq: Four times a day (QID) | ORAL | 0 refills | Status: DC | PRN

## 2017-01-25 MED ORDER — CEPHALEXIN 500 MG PO CAPS: 500.0000 mg | ORAL_CAPSULE | Freq: Three times a day (TID) | ORAL | 0 refills | Status: AC

## 2017-01-25 NOTE — Progress Notes (Signed)
Spoke with nurse and patient Patient feels good and mobile and belly benign Dc drain and shower and eat and d/c home later this am

## 2017-01-25 NOTE — Discharge Planning (Signed)
Patient IV removed.  Patient discharge papers given, explained and educated.  Foley changed to leg bag with teachback and also discussed wound care at home.  Supplies given to get through first few days.  Informed of suggested FU appts and Urology stated they would call patient to set appt.  Also agreed to e-scribe Ditropan to patient agreed to pharm.  Pain scripts signed, printed and given.  JP drain removed per orders.  RN assessment and VS revealed stability for DC to home.  Patient walked to front and family transporting home via car.

## 2017-01-25 NOTE — Discharge Summary (Signed)
Date of admission: 01/23/2017  Date of discharge: 01/25/2017  Admission diagnosis: bladder perforation and foreign body  Discharge diagnosis: bladder perforation and foreign body  Secondary diagnoses: blood in urine  History and Physical: For full details, please see admission history and physical. Briefly, Rynlee Lisbon is a 26 y.o. year old patient with above diagnosis.   Hospital Course: Laparoscopic repair of bladder and removal of foreign body. Excellent post op course  Laboratory values:  Recent Labs  01/23/17 2044  HGB 14.7  HCT 43.6    Recent Labs  01/23/17 2044  CREATININE 1.01*    Disposition: Home  Discharge instruction: The patient was instructed to be ambulatory but told to refrain from heavy lifting, strenuous activity, or driving. Detailed  Discharge medications:  Allergies as of 01/25/2017   No Known Allergies     Medication List    TAKE these medications   cephALEXin 500 MG capsule Commonly known as:  KEFLEX Take 1 capsule (500 mg total) by mouth 3 (three) times daily.   HYDROcodone-acetaminophen 5-325 MG tablet Commonly known as:  NORCO Take 1 tablet by mouth every 6 (six) hours as needed for moderate pain.   methylphenidate 10 MG tablet Commonly known as:  RITALIN Take 1 tablet (10 mg total) by mouth 3 (three) times daily.   methylphenidate 10 MG tablet Commonly known as:  RITALIN Take 1 tablet (10 mg total) by mouth 3 (three) times daily. Please fill 30days after first Rx.   methylphenidate 10 MG tablet Commonly known as:  RITALIN Take 1 tablet (10 mg total) by mouth 3 (three) times daily. Please fill 60 days after first rx   OVER THE COUNTER MEDICATION Take 2 tablets by mouth 2 (two) times daily. Hydroxycut       Followup:  Follow-up Information    Bravery Ketcham, Lorin Picket, MD Follow up.   Specialty:  Urology Contact information: [address] [place] [address] [phone]

## 2017-01-25 NOTE — Discharge Instructions (Signed)
I have reviewed discharge instructions in detail with the patient. They will follow-up with me or their physician as scheduled. My nurse will also be calling the patients as per protocol.   

## 2017-02-03 ENCOUNTER — Other Ambulatory Visit: Payer: Self-pay | Admitting: Urology

## 2017-02-03 DIAGNOSIS — S3723XA Laceration of bladder, initial encounter: Secondary | ICD-10-CM

## 2017-02-04 ENCOUNTER — Ambulatory Visit (HOSPITAL_COMMUNITY)
Admission: RE | Admit: 2017-02-04 | Discharge: 2017-02-04 | Disposition: A | Discharge status: Home / Self Care | Payer: BLUE CROSS/BLUE SHIELD | Source: Ambulatory Visit | Attending: Urology | Admitting: Urology

## 2017-02-04 DIAGNOSIS — X58XXXA Exposure to other specified factors, initial encounter: Secondary | ICD-10-CM | POA: Diagnosis not present

## 2017-02-04 DIAGNOSIS — S3723XA Laceration of bladder, initial encounter: Secondary | ICD-10-CM | POA: Insufficient documentation

## 2017-02-04 MED ORDER — IOTHALAMATE MEGLUMINE 17.2 % UR SOLN
250.0000 mL | Freq: Once | URETHRAL | Status: AC | PRN
  Administered 2017-02-04: 250 mL via INTRAVESICAL

## 2017-02-07 ENCOUNTER — Telehealth: Payer: Self-pay | Admitting: Family Medicine

## 2017-02-07 ENCOUNTER — Other Ambulatory Visit: Payer: Self-pay | Admitting: Family Medicine

## 2017-02-07 DIAGNOSIS — F988 Other specified behavioral and emotional disorders with onset usually occurring in childhood and adolescence: Secondary | ICD-10-CM

## 2017-02-07 MED ORDER — METHYLPHENIDATE HCL 10 MG PO TABS: 10.0000 mg | ORAL_TABLET | Freq: Three times a day (TID) | ORAL | 0 refills | Status: DC

## 2017-02-07 NOTE — Telephone Encounter (Signed)
Disregard

## 2017-02-07 NOTE — Telephone Encounter (Signed)
Pt states that she needs a refill on ritalin

## 2017-02-07 NOTE — Telephone Encounter (Signed)
Last OV 09/06/16 Ritalin last filled 11/08/16 #93 with 0  Pt given 3 prescriptions.

## 2017-02-07 NOTE — Telephone Encounter (Signed)
Will advise pt available at front desk for pick up.

## 2017-02-14 ENCOUNTER — Encounter: Payer: Self-pay | Admitting: Physician Assistant

## 2017-02-14 ENCOUNTER — Ambulatory Visit (INDEPENDENT_AMBULATORY_CARE_PROVIDER_SITE_OTHER): Payer: BLUE CROSS/BLUE SHIELD | Admitting: Physician Assistant

## 2017-02-14 VITALS — BP 142/86 | HR 104 | Temp 99.2°F | Resp 14 | Ht 67.0 in | Wt 263.0 lb

## 2017-02-14 DIAGNOSIS — J020 Streptococcal pharyngitis: Secondary | ICD-10-CM

## 2017-02-14 LAB — POCT RAPID STREP A (OFFICE): RAPID STREP A SCREEN: POSITIVE — AB

## 2017-02-14 MED ORDER — AMOXICILLIN 500 MG PO CAPS: 500.0000 mg | ORAL_CAPSULE | Freq: Two times a day (BID) | ORAL | 0 refills | Status: DC

## 2017-02-14 NOTE — Progress Notes (Signed)
Pre visit review using our clinic review tool, if applicable. No additional management support is needed unless otherwise documented below in the visit note. 

## 2017-02-14 NOTE — Patient Instructions (Signed)
Please take antibiotic as directed with food. Increase fluids. Tylenol or Ibuprofen for fevers, aches and sore throat. Start salt-water gargles.  Place a humidifier in the bedroom.  Follow-up if symptoms are not resolving.   Strep Throat Strep throat is a bacterial infection of the throat. Your health care provider may call the infection tonsillitis or pharyngitis, depending on whether there is swelling in the tonsils or at the back of the throat. Strep throat is most common during the cold months of the year in children who are 955-28 years of age, but it can happen during any season in people of any age. This infection is spread from person to person (contagious) through coughing, sneezing, or close contact. What are the causes? Strep throat is caused by the bacteria called Streptococcus pyogenes. What increases the risk? This condition is more likely to develop in:  People who spend time in crowded places where the infection can spread easily.  People who have close contact with someone who has strep throat.  What are the signs or symptoms? Symptoms of this condition include:  Fever or chills.  Redness, swelling, or pain in the tonsils or throat.  Pain or difficulty when swallowing.  White or yellow spots on the tonsils or throat.  Swollen, tender glands in the neck or under the jaw.  Red rash all over the body (rare).  How is this diagnosed? This condition is diagnosed by performing a rapid strep test or by taking a swab of your throat (throat culture test). Results from a rapid strep test are usually ready in a few minutes, but throat culture test results are available after one or two days. How is this treated? This condition is treated with antibiotic medicine. Follow these instructions at home: Medicines  Take over-the-counter and prescription medicines only as told by your health care provider.  Take your antibiotic as told by your health care provider. Do not stop  taking the antibiotic even if you start to feel better.  Have family members who also have a sore throat or fever tested for strep throat. They may need antibiotics if they have the strep infection. Eating and drinking  Do not share food, drinking cups, or personal items that could cause the infection to spread to other people.  If swallowing is difficult, try eating soft foods until your sore throat feels better.  Drink enough fluid to keep your urine clear or pale yellow. General instructions  Gargle with a salt-water mixture 3-4 times per day or as needed. To make a salt-water mixture, completely dissolve -1 tsp of salt in 1 cup of warm water.  Make sure that all household members wash their hands well.  Get plenty of rest.  Stay home from school or work until you have been taking antibiotics for 24 hours.  Keep all follow-up visits as told by your health care provider. This is important. Contact a health care provider if:  The glands in your neck continue to get bigger.  You develop a rash, cough, or earache.  You cough up a thick liquid that is green, yellow-brown, or bloody.  You have pain or discomfort that does not get better with medicine.  Your problems seem to be getting worse rather than better.  You have a fever. Get help right away if:  You have new symptoms, such as vomiting, severe headache, stiff or painful neck, chest pain, or shortness of breath.  You have severe throat pain, drooling, or changes in your voice.  You have swelling of the neck, or the skin on the neck becomes red and tender.  You have signs of dehydration, such as fatigue, dry mouth, and decreased urination.  You become increasingly sleepy, or you cannot wake up completely.  Your joints become red or painful. This information is not intended to replace advice given to you by your health care provider. Make sure you discuss any questions you have with your health care  provider. Document Released: 04/09/2000 Document Revised: 12/10/2015 Document Reviewed: 08/05/2014 Elsevier Interactive Patient Education  2017 [employer].

## 2017-02-14 NOTE — Progress Notes (Signed)
Subjective:     Crystal Clements is a 26 y.o. female who presents for evaluation of symptoms of a URI. Symptoms include congestion, no  fever, non productive cough, post nasal drip and sore throat. Onset of symptoms was 2 days ago, and has been gradually worsening since that time. Is not hydrating well. Denies recent travel or sick contact. Treatment to date: decongestants.  The following portions of the patient's history were reviewed and updated as appropriate: allergies, current medications, past family history, past medical history, past social history, past surgical history and problem list.  Review of Systems Pertinent items are noted in HPI.   Objective:    BP (!) 142/86   Pulse (!) 104   Temp 99.2 F (37.3 C) (Oral)   Resp 14   Ht 5\' 7"  (1.702 m)   Wt 263 lb (119.3 kg)   LMP 01/28/2017   SpO2 98%   BMI 41.19 kg/m  General appearance: alert, cooperative and no distress Head: Normocephalic, without obvious abnormality, atraumatic Ears: normal TM's and external ear canals both ears Nose: Nares normal. Septum midline. Mucosa normal. No drainage or sinus tenderness. Throat: abnormal findings: exudates present and moderate oropharyngeal erythema Lungs: clear to auscultation bilaterally Heart: regular rate and rhythm, S1, S2 normal, no murmur, click, rub or gallop Extremities: extremities normal, atraumatic, no cyanosis or edema Lymph nodes: Cervical, supraclavicular, and axillary nodes normal.   Assessment:    strep pharyngitis   Plan:   Start Amoxicillin 500 mg BID x 10 days. Supportive measures and OTC medications reviewed. Follow-up if symptoms are not resolving.

## 2017-03-08 ENCOUNTER — Encounter: Payer: BLUE CROSS/BLUE SHIELD | Admitting: Family Medicine

## 2017-05-09 ENCOUNTER — Encounter: Payer: Self-pay | Admitting: General Practice

## 2017-05-09 ENCOUNTER — Telehealth: Payer: Self-pay | Admitting: Family Medicine

## 2017-05-09 DIAGNOSIS — F988 Other specified behavioral and emotional disorders with onset usually occurring in childhood and adolescence: Secondary | ICD-10-CM

## 2017-05-09 MED ORDER — METHYLPHENIDATE HCL 10 MG PO TABS: 10.0000 mg | ORAL_TABLET | Freq: Three times a day (TID) | ORAL | 0 refills | Status: DC

## 2017-05-09 NOTE — Telephone Encounter (Signed)
Prescription filled.

## 2017-05-09 NOTE — Telephone Encounter (Signed)
Last OV 02/14/17 (strep- [place]) Ritalin last filled 02/07/18 #93 for 3 months

## 2017-05-09 NOTE — Telephone Encounter (Signed)
Copied from CRM [id number] [phone]. Topic: Inquiry >> May 09, 2017  9:41 AM Yvonna Alanisobinson, Andra M wrote: Reason for CRM: Patient has called requesting a refill of methylphenidate (RITALIN) 10 MG tablet. [id number] [place] - [address] [facility] [address] [phone] (Phone) [phone] (Fax). Patient is completely out of this medication.       Thank You!!!    >> May 09, 2017  4:30 PM Rudi CocoLathan, Kellene Mccleary M, NT wrote: Odetta [facility] and [facility] do not [facility] the med. So pt. Called back to see if med. [facility] [address]. Pt. Can be reached at [phone]  [facility] [id number] [place] - [address] AT [facility] [address]  [address] [place] [id number]  Phone: [phone] Fax: [phone]

## 2017-05-09 NOTE — Telephone Encounter (Signed)
Request for controlled substance 

## 2017-05-09 NOTE — Telephone Encounter (Signed)
Pt should get what is available as this is a controlled substance and we do not send them to multiple pharmacies.  Remainder will be available before she runs out.

## 2017-05-09 NOTE — Telephone Encounter (Signed)
Called pt and tried to leave a message, voicemail is full. Sent a mychart message to pt to advise.

## 2017-05-09 NOTE — Telephone Encounter (Signed)
Called and spoke with pharmacist. They have #11 of the 5693 and will have the remainder in on Wednesday. Please advise?

## 2017-05-09 NOTE — Telephone Encounter (Signed)
Copied from CRM [id number]. Topic: Inquiry >> May 09, 2017  9:41 AM Yvonna Alanisobinson, Andra M wrote: Reason for CRM: Patient has called requesting a refill of methylphenidate (RITALIN) 10 MG tablet. [facility]'s [facility] [id number] [place] - [address] AT [facility] [address] [facility] [address] [phone] (Phone) [phone] (Fax). Patient is completely out of this medication.       Thank You!!!

## 2017-05-11 MED ORDER — METHYLPHENIDATE HCL 10 MG PO TABS: 10.0000 mg | ORAL_TABLET | Freq: Three times a day (TID) | ORAL | 0 refills | Status: DC

## 2017-05-27 ENCOUNTER — Encounter: Payer: Self-pay | Admitting: Family Medicine

## 2017-05-27 ENCOUNTER — Telehealth: Payer: Self-pay | Admitting: *Deleted

## 2017-05-27 NOTE — Telephone Encounter (Signed)
Copied from CRM [id number]. Topic: Inquiry >> May 09, 2017  9:41 AM Yvonna Alanisobinson, Andra M wrote: Reason for CRM: Patient has called requesting a refill of methylphenidate (RITALIN) 10 MG tablet. [facility]'s [facility] [id number] - [place] - [address] AT [facility] [address] [facility] [phone] (Phone) [phone] (Fax). Patient is completely out of this medication.       Thank You!!!    >> May 09, 2017  4:30 PM Rudi CocoLathan, Latoya M, NT wrote: Odetta [facility]Med.[facility] and [facility] not [facility] the med. So pt. Called back to see if med. [facility] [address]. Pt. Can be reached at [phone]  [facility] [id number] - [place] - [address] AT [facility] [address] & [facility] [address]  [address] [place] [id number]  Phone: [phone] Fax: [phone]    >> May 27, 2017 10:02 AM Cipriano BunkerLambe, Annette S wrote: Pt. Is calling about prescription Ritalin - [facility] only [facility] had a half of the  prescription.   She is asking for a prescription be written and she pick it up..Marland Kitchen

## 2017-05-27 NOTE — Telephone Encounter (Signed)
Spoke with pt and she advised that she only received 11 pills on 05/09/17, please advise. She said that she picked them up because PCP advised her to.   Pt was in tears on the phone.

## 2017-05-27 NOTE — Telephone Encounter (Signed)
After printing Drug report and verifying with pharmacy. Pt also took that second prescription and [facility] [address] on 05/11/17.

## 2017-05-27 NOTE — Telephone Encounter (Signed)
Please advise? Pt has been trying to get this medication since last month?

## 2017-05-27 NOTE — Telephone Encounter (Signed)
Called the pharmacy and they advised that she picked up #93 on 05/09/17. They do have this in stock and if you want to write this rx for them to hold until the 14th.   Please advise?

## 2017-05-27 NOTE — Telephone Encounter (Signed)
If pt has picked up #142 pills in the last 2 weeks, there is no need for refill

## 2017-05-27 NOTE — Telephone Encounter (Signed)
If [facility] only gave her half of a script, she should be able [facility] and [facility] the remaining part of the script. Shouldn't need new prescription yet.

## 2017-05-30 NOTE — Telephone Encounter (Signed)
Pt responded via MyChart to alert us to the fact that she picked up her medication.

## 2017-06-13 ENCOUNTER — Encounter: Payer: BLUE CROSS/BLUE SHIELD | Admitting: Family Medicine

## 2017-06-23 ENCOUNTER — Other Ambulatory Visit: Payer: Self-pay | Admitting: Family Medicine

## 2017-06-23 DIAGNOSIS — F988 Other specified behavioral and emotional disorders with onset usually occurring in childhood and adolescence: Secondary | ICD-10-CM

## 2017-06-23 MED ORDER — METHYLPHENIDATE HCL 10 MG PO TABS: 10.0000 mg | ORAL_TABLET | Freq: Three times a day (TID) | ORAL | 0 refills | Status: DC

## 2017-06-23 NOTE — Telephone Encounter (Signed)
Last OV 02/14/17 Ritalin last filled 05/11/17 #82 with 0

## 2017-07-02 ENCOUNTER — Emergency Department
Admission: EM | Admit: 2017-07-02 | Discharge: 2017-07-02 | Disposition: A | Discharge status: Home / Self Care | Payer: BLUE CROSS/BLUE SHIELD | Source: Home / Self Care | Attending: Emergency Medicine | Admitting: Emergency Medicine

## 2017-07-02 ENCOUNTER — Encounter: Payer: Self-pay | Admitting: Emergency Medicine

## 2017-07-02 DIAGNOSIS — L03211 Cellulitis of face: Secondary | ICD-10-CM

## 2017-07-02 DIAGNOSIS — J039 Acute tonsillitis, unspecified: Secondary | ICD-10-CM

## 2017-07-02 DIAGNOSIS — J0100 Acute maxillary sinusitis, unspecified: Secondary | ICD-10-CM

## 2017-07-02 DIAGNOSIS — J029 Acute pharyngitis, unspecified: Secondary | ICD-10-CM | POA: Diagnosis not present

## 2017-07-02 LAB — POCT RAPID STREP A (OFFICE): Rapid Strep A Screen: NEGATIVE

## 2017-07-02 MED ORDER — HYDROCODONE-ACETAMINOPHEN 5-325 MG PO TABS: 1.0000 | ORAL_TABLET | ORAL | 0 refills | Status: DC | PRN

## 2017-07-02 MED ORDER — AMOXICILLIN-POT CLAVULANATE 875-125 MG PO TABS: 1.0000 | ORAL_TABLET | Freq: Two times a day (BID) | ORAL | 0 refills | Status: DC

## 2017-07-02 MED ORDER — CEFTRIAXONE SODIUM 1 G IJ SOLR
1.0000 g | INTRAMUSCULAR | Status: AC
  Administered 2017-07-02: 1 g via INTRAMUSCULAR

## 2017-07-02 NOTE — ED Provider Notes (Addendum)
[facility]    CSN: [id number] Arrival date & time: 07/02/17  1511     History   Chief Complaint Chief Complaint  Patient presents with  . Sore Throat    HPI Crystal Clements is a 27 y.o. female.   HPI Started a week ago, fever chills sore throat cough myalgias and right sided facial pain. Symptoms have progressed in the last week and today noted moderate to severely painful swelling right side of face and sore throat is severe.  No radiation of pain.  Pain is worsened by swallowing or touching the right cheek.  No drainage from the outside of her face.  Still having some low-grade fever and chills. Associated symptoms: No nausea or vomiting or chest pain or shortness of breath or abdominal pain or GU symptoms.  No ear drainage Denies chance of pregnancy. Denies any dental pain.  Denies bleeding from inside of mouth Past Medical History:  Diagnosis Date  . ADD (attention deficit disorder with hyperactivity)     Patient Active Problem List   Diagnosis Date Noted  . 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 Morbid obesity (HCC) 09/06/2016  . Sinusitis 10/12/2010  . Screening for malignant neoplasm of cervix 10/12/2010  . General medical examination 09/15/2010  . Attention deficit disorder 10/29/2009  . UTI 12/04/2008  . RASH-NONVESICULAR 08/23/2008    Past Surgical History:  Procedure Laterality Date  . BLADDER NECK RECONSTRUCTION N/A 01/23/2017   Procedure: BLADDER REPAIR;  Surgeon: Alfredo Martinez, MD;  Location: [facility];  Service: Urology;  Laterality: N/A;  . FOREIGN BODY RETRIEVAL N/A 01/23/2017   Procedure: FOREIGN BODY RETRIEVAL;  Surgeon: Alfredo Martinez, MD;  Location: [facility];  Service: Urology;  Laterality: N/A;  . LAPAROSCOPIC ABDOMINAL EXPLORATION  01/23/2017   Procedure: LAPAROSCOPIC ABDOMINAL EXPLORATION;  Surgeon: Alfredo Martinez, MD;  Location: [facility];  Service: Urology;;    OB History    No data available       Home  Medications    Prior to Admission medications   Medication Sig Start Date End Date Taking? Authorizing Provider  amoxicillin-clavulanate (AUGMENTIN) 875-125 MG tablet Take 1 tablet by mouth every 12 (twelve) hours. Take for 10 days. Take with food. 07/02/17   Lajean Manes, MD  HYDROcodone-acetaminophen (NORCO/VICODIN) 5-325 MG tablet Take 1-2 tablets by mouth every 4 (four) hours as needed for severe pain. Take with food. 07/02/17   Lajean Manes, MD  methylphenidate (RITALIN) 10 MG tablet Take 1 tablet (10 mg total) by mouth 3 (three) times daily. 06/23/17 06/23/18  Sheliah Hatch, MD    Family History Family History  Problem Relation Age of Onset  . Breast cancer Maternal Grandmother        31s  . Pancreatic cancer Maternal Grandmother   . Lung cancer Maternal Aunt        smoker    Social History Social History   Tobacco Use  . Smoking status: Never Smoker  . Smokeless tobacco: Never Used  Substance Use Topics  . Alcohol use: Yes    Alcohol/week: 1.5 oz    Types: 3 Standard drinks or equivalent per week    Comment: occ.  . Drug use: No     Allergies   Patient has no known allergies.   Review of Systems Review of Systems  Constitutional: Positive for fever.  HENT: Positive for ear pain, facial swelling (Right-sided.  No swelling around eyes), rhinorrhea (Mild, light yellow), sinus pressure, sinus pain (Right-sided) and sore throat. Negative for  drooling, ear discharge, nosebleeds, tinnitus, trouble swallowing and voice change.   Respiratory: Positive for cough (Mild, mostly nonproductive). Negative for choking, chest tightness, shortness of breath, wheezing and stridor.   Cardiovascular: Negative for chest pain, palpitations and leg swelling.  Gastrointestinal: Negative for abdominal distention, abdominal pain, diarrhea, nausea and vomiting.  Genitourinary: Negative.   Musculoskeletal: Negative for neck pain and neck stiffness.  Skin: Negative for rash.  Neurological:  Negative for seizures, syncope and light-headedness.  Psychiatric/Behavioral: Negative for confusion and hallucinations.  All other systems reviewed and are negative.    Physical Exam Triage Vital Signs ED Triage Vitals [07/02/17 1658]  Enc Vitals Group     BP (!) 150/97     Pulse Rate 87     Resp 16     Temp 98.2 F (36.8 C)     Temp Source Oral     SpO2 99 %     Weight 278 lb 12 oz (126.4 kg)     Height 5\' 7"  (1.702 m)     Head Circumference      Peak Flow      Pain Score 3     Pain Loc      Pain Edu?      Excl. in GC?    No data found.  Updated Vital Signs BP (!) 150/97 (BP Location: Right Arm)   Pulse 87   Temp 98.2 F (36.8 C) (Oral)   Resp 16   Ht 5\' 7"  (1.702 m)   Wt 278 lb 12 oz (126.4 kg)   SpO2 99%   BMI 43.66 kg/m    Physical Exam  Constitutional: She is oriented to person, place, and time. She appears well-developed and well-nourished.  Non-toxic appearance. She appears ill. No distress.  HENT:  Head: Normocephalic and atraumatic. Head is without right periorbital erythema and without left periorbital erythema.  Right Ear: Tympanic membrane, external ear and ear canal normal.  Left Ear: Tympanic membrane, external ear and ear canal normal.  Nose: Right sinus exhibits maxillary sinus tenderness. Right sinus exhibits no frontal sinus tenderness. Left sinus exhibits no maxillary sinus tenderness and no frontal sinus tenderness.  Mouth/Throat: Uvula is midline and mucous membranes are normal. No oral lesions. Posterior oropharyngeal erythema present. No oropharyngeal exudate or tonsillar abscesses.  Oropharynx:  3+ Tonsillar enlargement.  No exudate Airway intact.  Right side of face mildly swollen, indurated, warm, without fluctuance.  No open wound or bleeding or drainage.  Eyes: Conjunctivae are normal. No scleral icterus.  Neck: Neck supple.  Cardiovascular: Normal rate, regular rhythm and normal heart sounds.  No murmur heard. Pulmonary/Chest:  Effort normal and breath sounds normal. No stridor. No respiratory distress. She has no wheezes. She has no rhonchi. She has no rales.  Abdominal: Soft. She exhibits no mass. There is no hepatosplenomegaly. There is no tenderness.  Lymphadenopathy:    She has cervical adenopathy.       Right cervical: Superficial cervical adenopathy present. No deep cervical and no posterior cervical adenopathy present.      Left cervical: Superficial cervical adenopathy present. No deep cervical and no posterior cervical adenopathy present.  Neurological: She is alert and oriented to person, place, and time. No cranial nerve deficit.  Skin: Skin is warm. No rash noted.  Psychiatric: She has a normal mood and affect.  Nursing note and vitals reviewed.    UC Treatments / Results  Labs (all labs ordered are listed, but only abnormal results are displayed) Labs Reviewed  CULTURE, GROUP A STREP  CULTURE, GROUP A STREP [facility])  POCT RAPID STREP A (OFFICE)  Rapid strep test negative. Throat swab sent off for culture  EKG  EKG Interpretation None       Radiology No results found.  Procedures Procedures (including critical care time)  Medications Ordered in UC Medications  cefTRIAXone (ROCEPHIN) injection 1 g (1 g Intramuscular Given 07/02/17 1719)     Initial Impression / Assessment and Plan / UC Course  I have reviewed the triage vital signs and the nursing notes.  Pertinent labs & imaging results that were available during my care of the patient were reviewed by me and considered in my medical decision making (see chart for details).      Final Clinical Impressions(s) / UC Diagnoses   Final diagnoses:  Acute tonsillitis, unspecified etiology  Facial cellulitis  Acute maxillary sinusitis, recurrence not specified  Severe tonsillitis, acute right-sided maxillary sinusitis with mild right facial cellulitis.  No evidence of periorbital cellulitis.  She is acutely ill but not toxic. Rapid  strep test negative, sent off strep culture.  ED Discharge Orders        Ordered    amoxicillin-clavulanate (AUGMENTIN) 875-125 MG tablet  Every 12 hours     07/02/17 1717    HYDROcodone-acetaminophen (NORCO/VICODIN) 5-325 MG tablet  Every 4 hours PRN     07/02/17 1717    Augmentin 875 twice daily times 10 days.       Vicodin. #8. no refills                                                                           1 or 2 every 4-6 hours as needed for severe pain Rapid strep test negative. Strep culture sent. An After Visit Summary was printed and given to the patient. You have tonsillitis, cellulitis (soft tissue infection right side of face), as well as sinus infection. No evidence of abscess. Please read attached instruction sheets. Today were giving you shot of Rocephin, which is a strong antibiotic to jumpstart your treatment. Take the Augmentin (amoxicillin clavulanate) twice a day as prescribed. For pain, Tylenol or ibuprofen for mild to moderate pain. I prescribed small amount of Vicodin if needed for severe pain.-Caution, may cause drowsiness. You need to call and follow-up with ear nose and throat specialist on Monday 07/04/17.-Names of [facility] and [facility], with other information, in this packet. If any severely worsening symptoms, go to emergency room immediately.   Controlled Substance Prescriptions [place] Controlled Substance Registry consulted? Yes, I have consulted the [place] Controlled Substances Registry for this patient, and feel the risk/benefit ratio today is favorable for proceeding with this prescription for a controlled substance.   Lajean Manes, MD 07/04/17 [place]    Lajean Manes, MD 07/04/17 [phone]

## 2017-07-02 NOTE — ED Triage Notes (Signed)
Patient states that she has been sick over a week Sore throat ear pain,body aches fever chills, and woke this AM with Edema noted below the right side of face. C/O pain 3/10

## 2017-07-02 NOTE — Discharge Instructions (Signed)
You have tonsillitis, cellulitis (soft tissue infection right side of face), as well as sinus infection and bronchitis. No evidence of abscess. Please read attached instruction sheets. Today were giving you shot of Rocephin, which is a strong antibiotic to jumpstart your treatment. Take the Augmentin (amoxicillin clavulanate) twice a day as prescribed. For pain, Tylenol or ibuprofen for mild to moderate pain. I prescribed small amount of Vicodin if needed for severe pain.-Caution, may cause drowsiness. You need to call and follow-up with your nose and throat specialist on Monday 07/04/17.-Names of [facility] and [facility], with other information, in this packet. If any severely worsening symptoms, go to emergency room immediately.

## 2017-07-04 ENCOUNTER — Telehealth: Payer: Self-pay | Admitting: Emergency Medicine

## 2017-07-04 LAB — CULTURE, GROUP A STREP
MICRO NUMBER:: 90305039
SPECIMEN QUALITY:: ADEQUATE

## 2017-07-22 ENCOUNTER — Other Ambulatory Visit: Payer: Self-pay | Admitting: Family Medicine

## 2017-07-22 DIAGNOSIS — F988 Other specified behavioral and emotional disorders with onset usually occurring in childhood and adolescence: Secondary | ICD-10-CM

## 2017-07-22 MED ORDER — METHYLPHENIDATE HCL 10 MG PO TABS: 10.0000 mg | ORAL_TABLET | Freq: Three times a day (TID) | ORAL | 0 refills | Status: DC

## 2017-07-22 NOTE — Telephone Encounter (Signed)
Last Refill: 06/23/17, #90 with 0 (pt takes 3 daily) Last OV: 09/06/16, scheduled appt: 09/23/17

## 2017-07-22 NOTE — Telephone Encounter (Signed)
Last  02/14/17 Ritalin last filled 06/23/17 #90 with 0

## 2017-07-22 NOTE — Telephone Encounter (Signed)
Please see message. Thanks!

## 2017-07-22 NOTE — Telephone Encounter (Signed)
Last OV with [facility] 09/06/2016, [facility]  Next OV 09/23/2017  Last filled 06/23/2017, 90 tablets with 0 refills.

## 2017-08-21 ENCOUNTER — Other Ambulatory Visit: Payer: Self-pay | Admitting: Physician Assistant

## 2017-08-21 DIAGNOSIS — F988 Other specified behavioral and emotional disorders with onset usually occurring in childhood and adolescence: Secondary | ICD-10-CM

## 2017-08-22 MED ORDER — METHYLPHENIDATE HCL 10 MG PO TABS: 10.0000 mg | ORAL_TABLET | Freq: Three times a day (TID) | ORAL | 0 refills | Status: DC

## 2017-08-22 NOTE — Telephone Encounter (Signed)
Will defer further refills of patient's medications to PCP  

## 2017-09-20 ENCOUNTER — Other Ambulatory Visit: Payer: Self-pay | Admitting: Family Medicine

## 2017-09-20 DIAGNOSIS — F988 Other specified behavioral and emotional disorders with onset usually occurring in childhood and adolescence: Secondary | ICD-10-CM

## 2017-09-20 MED ORDER — METHYLPHENIDATE HCL 10 MG PO TABS: 10.0000 mg | ORAL_TABLET | Freq: Three times a day (TID) | ORAL | 0 refills | Status: DC

## 2017-09-20 NOTE — Telephone Encounter (Signed)
Last OV 02/14/17 Ritalin last filled 08/22/17 #90 with 0

## 2017-09-23 ENCOUNTER — Other Ambulatory Visit: Payer: Self-pay

## 2017-09-23 ENCOUNTER — Ambulatory Visit (INDEPENDENT_AMBULATORY_CARE_PROVIDER_SITE_OTHER): Payer: BLUE CROSS/BLUE SHIELD | Admitting: Family Medicine

## 2017-09-23 ENCOUNTER — Other Ambulatory Visit (HOSPITAL_COMMUNITY)
Admission: RE | Admit: 2017-09-23 | Discharge: 2017-09-23 | Disposition: A | Discharge status: Home / Self Care | Payer: BLUE CROSS/BLUE SHIELD | Source: Ambulatory Visit | Attending: Family Medicine | Admitting: Family Medicine

## 2017-09-23 ENCOUNTER — Encounter: Payer: Self-pay | Admitting: Family Medicine

## 2017-09-23 VITALS — BP 126/88 | HR 100 | Temp 98.8°F | Resp 16 | Ht 66.75 in | Wt 282.6 lb

## 2017-09-23 DIAGNOSIS — Z124 Encounter for screening for malignant neoplasm of cervix: Secondary | ICD-10-CM | POA: Insufficient documentation

## 2017-09-23 DIAGNOSIS — Z0000 Encounter for general adult medical examination without abnormal findings: Secondary | ICD-10-CM

## 2017-09-23 MED ORDER — NORETHINDRONE ACET-ETHINYL EST 1-20 MG-MCG PO TABS: 1.0000 | ORAL_TABLET | Freq: Every day | ORAL | 11 refills | Status: DC

## 2017-09-23 NOTE — Assessment & Plan Note (Signed)
Pap collected. 

## 2017-09-23 NOTE — Assessment & Plan Note (Signed)
Ongoing issue.  Pt has gained 20 lbs since last visit.  Stressed need for healthy diet and regular exercise.  Check labs to risk stratify.  Will follow.

## 2017-09-23 NOTE — Progress Notes (Signed)
   Subjective:    Patient ID: Crystal Clements, female    DOB: 14-Sep-1990, 27 y.o.   MRN: [id number]  HPI CPE- UTD on immunizations.  Due for pap.  Pt has gained 20 lbs- this is due to breakup over the past year.  Things are better now and pt has resumed exercising.   Review of Systems Patient reports no vision/ hearing changes, adenopathy,fever,  persistant/recurrent hoarseness , swallowing issues, chest pain, palpitations, edema, persistant/recurrent cough, hemoptysis, dyspnea (rest/exertional/paroxysmal nocturnal), gastrointestinal bleeding (melena, rectal bleeding), abdominal pain, significant heartburn, bowel changes, GU symptoms (dysuria, hematuria, incontinence), Gyn symptoms (abnormal  bleeding, pain),  syncope, focal weakness, memory loss, numbness & tingling, skin/hair/nail changes, abnormal bruising or bleeding, anxiety, or depression.     Objective:   Physical Exam  General Appearance:    Alert, cooperative, no distress, appears stated age, obese  Head:    Normocephalic, without obvious abnormality, atraumatic  Eyes:    PERRL, conjunctiva/corneas clear, EOM's intact, fundi    benign, both eyes  Ears:    Normal TM's and external ear canals, both ears  Nose:   Nares normal, septum midline, mucosa normal, no drainage    or sinus tenderness  Throat:   Lips, mucosa, and tongue normal; teeth and gums normal  Neck:   Supple, symmetrical, trachea midline, no adenopathy;    Thyroid: no enlargement/tenderness/nodules  Back:     Symmetric, no curvature, ROM normal, no CVA tenderness  Lungs:     Clear to auscultation bilaterally, respirations unlabored  Chest Wall:    No tenderness or deformity   Heart:    Regular rate and rhythm, S1 and S2 normal, no murmur, rub   or gallop  Breast Exam:    No tenderness, masses, or nipple abnormality  Abdomen:     Soft, non-tender, bowel sounds active all four quadrants,    no masses, no organomegaly  Genitalia:    External genitalia normal, cervix  normal in appearance, no CMT, uterus in normal size and position, adnexa w/out mass or tenderness, mucosa pink and moist, no lesions or discharge present  Rectal:    Normal external appearance  Extremities:   Extremities normal, atraumatic, no cyanosis or edema  Pulses:   2+ and symmetric all extremities  Skin:   Skin color, texture, turgor normal, no rashes or lesions  Lymph nodes:   Cervical, supraclavicular, and axillary nodes normal  Neurologic:   CNII-XII intact, normal strength, sensation and reflexes    throughout          Assessment & Plan:

## 2017-09-23 NOTE — Patient Instructions (Signed)
Follow up in 1 year or as needed We'll notify you of your lab results and make any changes if needed Continue to work on healthy diet and regular exercise- you can do it! If your pill pack only has 21 pills- skip a week to allow your period and then start the new pack Start the pill pack when you have your next cycle Call with any questions or concerns Have a great weekend!!!

## 2017-09-23 NOTE — Assessment & Plan Note (Signed)
Pt's PE WNL w/ exception of obesity.  Pap collected.  UTD on mammo.  Check labs.  Anticipatory guidance provided.

## 2017-09-24 LAB — CBC WITH DIFFERENTIAL/PLATELET
BASOS ABS: 56 {cells}/uL (ref 0–200)
BASOS PCT: 0.5 %
EOS ABS: 178 {cells}/uL (ref 15–500)
EOS PCT: 1.6 %
HEMATOCRIT: 41.1 % (ref 35.0–45.0)
Hemoglobin: 13.9 g/dL (ref 11.7–15.5)
LYMPHS ABS: 3130 {cells}/uL (ref 850–3900)
MCH: 27.5 pg (ref 27.0–33.0)
MCHC: 33.8 g/dL (ref 32.0–36.0)
MCV: 81.2 fL (ref 80.0–100.0)
MPV: 12.1 fL (ref 7.5–12.5)
Monocytes Relative: 5.3 %
NEUTROS ABS: 7148 {cells}/uL (ref 1500–7800)
Neutrophils Relative %: 64.4 %
Platelets: 325 10*3/uL (ref 140–400)
RBC: 5.06 10*6/uL (ref 3.80–5.10)
RDW: 12.9 % (ref 11.0–15.0)
Total Lymphocyte: 28.2 %
WBC mixed population: 588 cells/uL (ref 200–950)
WBC: 11.1 10*3/uL — ABNORMAL HIGH (ref 3.8–10.8)

## 2017-09-24 LAB — LIPID PANEL
Cholesterol: 196 mg/dL (ref <200)
HDL: 45 mg/dL — ABNORMAL LOW (ref >50)
LDL Cholesterol (Calc): 129 mg/dL (calc) — ABNORMAL HIGH
Non-HDL Cholesterol (Calc): 151 mg/dL (calc) — ABNORMAL HIGH (ref <130)
Total CHOL/HDL Ratio: 4.4 (calc) (ref <5.0)
Triglycerides: 117 mg/dL (ref <150)

## 2017-09-24 LAB — HEPATIC FUNCTION PANEL
AG RATIO: 1.7 (calc) (ref 1.0–2.5)
ALBUMIN MSPROF: 4.3 g/dL (ref 3.6–5.1)
ALT: 21 U/L (ref 6–29)
AST: 23 U/L (ref 10–30)
Alkaline phosphatase (APISO): 87 U/L (ref 33–115)
BILIRUBIN TOTAL: 0.7 mg/dL (ref 0.2–1.2)
Bilirubin, Direct: 0.1 mg/dL (ref 0.0–0.2)
Globulin: 2.6 g/dL (calc) (ref 1.9–3.7)
Indirect Bilirubin: 0.6 mg/dL (calc) (ref 0.2–1.2)
Total Protein: 6.9 g/dL (ref 6.1–8.1)

## 2017-09-24 LAB — BASIC METABOLIC PANEL
BUN: 7 mg/dL (ref 7–25)
CO2: 23 mmol/L (ref 20–32)
CREATININE: 0.75 mg/dL (ref 0.50–1.10)
Calcium: 9.1 mg/dL (ref 8.6–10.2)
Chloride: 107 mmol/L (ref 98–110)
GLUCOSE: 90 mg/dL (ref 65–99)
Potassium: 3.9 mmol/L (ref 3.5–5.3)
SODIUM: 141 mmol/L (ref 135–146)

## 2017-09-24 LAB — TSH: TSH: 2.03 mIU/L

## 2017-09-26 ENCOUNTER — Encounter: Payer: Self-pay | Admitting: General Practice

## 2017-09-27 ENCOUNTER — Encounter: Payer: Self-pay | Admitting: General Practice

## 2017-09-27 LAB — CYTOLOGY - PAP
Diagnosis: NEGATIVE
HPV: NOT DETECTED

## 2017-10-20 ENCOUNTER — Other Ambulatory Visit: Payer: Self-pay | Admitting: Family Medicine

## 2017-10-20 DIAGNOSIS — F988 Other specified behavioral and emotional disorders with onset usually occurring in childhood and adolescence: Secondary | ICD-10-CM

## 2017-10-20 MED ORDER — METHYLPHENIDATE HCL 10 MG PO TABS: 10.0000 mg | ORAL_TABLET | Freq: Three times a day (TID) | ORAL | 0 refills | Status: DC

## 2017-10-20 NOTE — Telephone Encounter (Signed)
Last OV 09/23/17 Ritalin last filled 09/20/17 #90 with 0

## 2017-11-18 ENCOUNTER — Other Ambulatory Visit: Payer: Self-pay | Admitting: Family Medicine

## 2017-11-18 DIAGNOSIS — F988 Other specified behavioral and emotional disorders with onset usually occurring in childhood and adolescence: Secondary | ICD-10-CM

## 2017-11-18 MED ORDER — METHYLPHENIDATE HCL 10 MG PO TABS: 10.0000 mg | ORAL_TABLET | Freq: Three times a day (TID) | ORAL | 0 refills | Status: DC

## 2017-11-18 NOTE — Telephone Encounter (Signed)
Last OV 09/23/17, No future OV  Last filled 10/20/17, # 90 with 0 refills

## 2017-12-19 ENCOUNTER — Other Ambulatory Visit: Payer: Self-pay | Admitting: Physician Assistant

## 2017-12-19 DIAGNOSIS — F988 Other specified behavioral and emotional disorders with onset usually occurring in childhood and adolescence: Secondary | ICD-10-CM

## 2017-12-19 MED ORDER — METHYLPHENIDATE HCL 10 MG PO TABS: 10.0000 mg | ORAL_TABLET | Freq: Three times a day (TID) | ORAL | 0 refills | Status: DC

## 2017-12-19 NOTE — Telephone Encounter (Signed)
Last OV 09/23/17, No future OV  Last filled 11/18/17, # 90 with 0 refills

## 2018-01-19 ENCOUNTER — Other Ambulatory Visit: Payer: Self-pay | Admitting: Family Medicine

## 2018-01-19 DIAGNOSIS — F988 Other specified behavioral and emotional disorders with onset usually occurring in childhood and adolescence: Secondary | ICD-10-CM

## 2018-01-19 MED ORDER — METHYLPHENIDATE HCL 10 MG PO TABS: 10.0000 mg | ORAL_TABLET | Freq: Three times a day (TID) | ORAL | 0 refills | Status: DC

## 2018-01-19 NOTE — Telephone Encounter (Signed)
Last OV 09/23/17 Ritalin last filled 12/19/17 #90 with 0

## 2018-02-17 ENCOUNTER — Other Ambulatory Visit: Payer: Self-pay | Admitting: Family Medicine

## 2018-02-17 DIAGNOSIS — F988 Other specified behavioral and emotional disorders with onset usually occurring in childhood and adolescence: Secondary | ICD-10-CM

## 2018-02-17 MED ORDER — METHYLPHENIDATE HCL 10 MG PO TABS: 10.0000 mg | ORAL_TABLET | Freq: Three times a day (TID) | ORAL | 0 refills | Status: DC

## 2018-02-17 NOTE — Telephone Encounter (Signed)
Last OV 09/23/17 Ritalin last filled 01/19/18 #90 with 0

## 2018-03-20 ENCOUNTER — Other Ambulatory Visit: Payer: Self-pay | Admitting: Family Medicine

## 2018-03-20 DIAGNOSIS — F988 Other specified behavioral and emotional disorders with onset usually occurring in childhood and adolescence: Secondary | ICD-10-CM

## 2018-03-20 MED ORDER — METHYLPHENIDATE HCL 10 MG PO TABS: 10.0000 mg | ORAL_TABLET | Freq: Three times a day (TID) | ORAL | 0 refills | Status: DC

## 2018-03-20 NOTE — Telephone Encounter (Signed)
Last OV 09/23/17 Ritalin last filled 02/17/18 #90 with 0

## 2018-04-20 ENCOUNTER — Other Ambulatory Visit: Payer: Self-pay | Admitting: Family Medicine

## 2018-04-20 DIAGNOSIS — F988 Other specified behavioral and emotional disorders with onset usually occurring in childhood and adolescence: Secondary | ICD-10-CM

## 2018-04-20 MED ORDER — METHYLPHENIDATE HCL 10 MG PO TABS: 10.0000 mg | ORAL_TABLET | Freq: Three times a day (TID) | ORAL | 0 refills | Status: DC

## 2018-04-20 NOTE — Telephone Encounter (Signed)
Last OV 09/23/17 Ritalin last filled 03/20/18 #90 with 0

## 2018-04-25 ENCOUNTER — Telehealth: Payer: BLUE CROSS/BLUE SHIELD | Admitting: Physician Assistant

## 2018-04-25 ENCOUNTER — Emergency Department (INDEPENDENT_AMBULATORY_CARE_PROVIDER_SITE_OTHER): Payer: BLUE CROSS/BLUE SHIELD

## 2018-04-25 ENCOUNTER — Emergency Department
Admission: EM | Admit: 2018-04-25 | Discharge: 2018-04-25 | Disposition: A | Discharge status: Home / Self Care | Payer: BLUE CROSS/BLUE SHIELD | Source: Home / Self Care | Attending: Family Medicine | Admitting: Family Medicine

## 2018-04-25 ENCOUNTER — Other Ambulatory Visit: Payer: Self-pay

## 2018-04-25 ENCOUNTER — Encounter: Payer: Self-pay | Admitting: Emergency Medicine

## 2018-04-25 DIAGNOSIS — J189 Pneumonia, unspecified organism: Secondary | ICD-10-CM

## 2018-04-25 DIAGNOSIS — R05 Cough: Secondary | ICD-10-CM

## 2018-04-25 DIAGNOSIS — R0602 Shortness of breath: Secondary | ICD-10-CM

## 2018-04-25 DIAGNOSIS — R079 Chest pain, unspecified: Secondary | ICD-10-CM

## 2018-04-25 DIAGNOSIS — J181 Lobar pneumonia, unspecified organism: Secondary | ICD-10-CM

## 2018-04-25 DIAGNOSIS — R059 Cough, unspecified: Secondary | ICD-10-CM

## 2018-04-25 MED ORDER — BENZONATATE 200 MG PO CAPS: ORAL_CAPSULE | ORAL | 0 refills | Status: DC

## 2018-04-25 MED ORDER — LEVOFLOXACIN 750 MG PO TABS: 750.0000 mg | ORAL_TABLET | Freq: Every day | ORAL | 0 refills | Status: AC

## 2018-04-25 MED ORDER — FLUCONAZOLE 150 MG PO TABS: ORAL_TABLET | ORAL | 1 refills | Status: DC

## 2018-04-25 MED ORDER — PREDNISONE 20 MG PO TABS: ORAL_TABLET | ORAL | 0 refills | Status: DC

## 2018-04-25 MED ORDER — IPRATROPIUM-ALBUTEROL 0.5-2.5 (3) MG/3ML IN SOLN
3.0000 mL | Freq: Once | RESPIRATORY_TRACT | Status: AC
  Administered 2018-04-25: 3 mL via RESPIRATORY_TRACT

## 2018-04-25 NOTE — Progress Notes (Signed)
I am very concerned that you may have pneumonia.  I would proceed directly to the [facility] so you may be evaluated today.  I would not delay.  Based on what you shared with me it looks like you have a serious condition that should be evaluated in a face to face office visit.  NOTE: If you entered your credit card information for this eVisit, you will not be charged. You may see a "hold" on your card for the $30 but that hold will drop off and you will not have a charge processed.  If you are having a true medical emergency please call 911.  If you need an urgent face to face visit, [place] has four urgent care centers for your convenience.  If you need care fast and have a high deductible or no insurance consider: ?  [email or web address] to reserve your spot online an avoid wait times  [facility] [address], [address] [place], [place] [address] 8 am to 8 pm Monday-Friday 10 am to 4 pm Saturday-Sunday *Across the street from [employer]  [facility]  [address] [place], [id number] 8 am to 5 pm Monday-Friday * In the [facility] on the [facility]   The following sites will take your insurance:  [facility]  [phone] Get Driving Directions Find a Provider at this Location  [address] [place], [place] [address] 10 am to 8 pm Monday-Friday 12 pm to 8 pm [facility]   [facility]  [phone] Get Driving Directions Find a Provider at this Location  [address] [place], [place] [address] 8 am to 8 pm Monday-Friday 9 am to 6 pm Saturday 11 am to 6 pm Sunday   [facility] at [facility] [place]  [phone] Get Driving Directions  [address] [place], [place] [address] 8 am to 8 pm Monday-Friday 8 am to 4 pm Saturday-Sunday   Your e-visit answers were reviewed by a board certified advanced clinical  practitioner to complete your personal care plan.  Thank you for using e-Visits.  ===View-only below this line===   ----- Message -----    From: Anabel BeneKelly Sabedra    Sent: 04/25/2018 10:24 AM EST      To: E-Visit Mailing List Subject: E-Visit Submission: Cough  E-Visit Submission: Cough --------------------------------  Question: How long have you been coughing? Answer:   7 or more days  Question: How would you describe the cough? Answer:   A cough from congested lungs  Question: How often are you coughing? Answer:   Constantly  Question: Does the cough prevent you from sleeping at night? Answer:   Yes  Question: What other symptoms have you experienced with the cough? Answer:   Sore throat            Chest pain            Wheezing  Question: Do you have a fever? Answer:   Yes, I have a low fever (less than 101 degrees)  Question: Describe your sore throat: Answer:   It started as general sore throat, slightly uncomfortable. That went away. Now its a burning sore feeling in my throat and chest, maybe from constant coughing and trouble breathing  Question: How long have you had a sore throat? Answer:   2 days  Question: Do you have any tenderness or swelling in your neck? Answer:   No  Question:  Are you coughing up any mucus? Answer:   I am coughing up a little bit of mucus  Question: Do you use a maintenance inhaler? Answer:   No  Question: Do you use a rescue inhaler (such as Ventolin?) Answer:   No  Question: Have you previously required a prescription for prednisone for cough? Answer:   No  Question: Are you diabetic? Answer:   No  Question: Are you pregnant? Answer:   I am confident that I am not pregnant  Question: Are you breastfeeding? Answer:   No  Question: What is the appearance of the mucus? Answer:   The mucus has changed from thin to thick            The mucus has changed from clear to colored  Question: Do you have any of the  following? Answer:   Sweating at night  Question: Do you smoke? Answer:   No  Question: Have you ever smoked? Answer:   I have never smoked  Question: Are there people you know with similar symptoms? Answer:   I am not sure  Question: Are you experiencing any of the following? Answer:   Coughing more when lying  Question: Are you having difficulty breathing? Answer:   Yes  Question: Please describe what kind of difficulty you are having breathing. Answer:   Breathing deep is uncomfortable and makes me cough. Since i have been sick, I get very winded very easily. I will have moments where it suddenly feels tight, like i cant take a deep breath. There is wheezing, and its very dry when i breathe and tightness in my chest  Question: Is your coughing worse when you are exposed to pollen, dust, or other things in the environment? Answer:   I dont know  Question: Have you been treated for a similar cough in the past? Answer:   No  Question: Have you ever been diagnosed with asthma, bronchitis, or lung disease? Answer:   I am not sure  Question: Have you recently started on any medications for your heart or for blood pressure? Answer:   No  Question: Have you recently been hospitalized? Answer:   No  Question: Please list your medication allergies that you may have ? (If 'none' , please list as 'none') Answer:   None  Question: Please list any additional comments  Answer:   I have been taking an expectorant, decongestant, pain reliever. I've tried otc cough suppressants, but they don't help much. Bought a humidifier and have used it the last 2 nights. Using pillows propped up when i sleep but still cough and wake up all through the night

## 2018-04-25 NOTE — ED Triage Notes (Signed)
Cough, congestion with green mucus, chest hurts, SOB x 1 week

## 2018-04-25 NOTE — Discharge Instructions (Addendum)
Take plain guaifenesin (1200mg  extended release tabs such as Mucinex) twice daily, with plenty of water, for cough and congestion.  May add Pseudoephedrine (30mg , one or two every 4 to 6 hours) for sinus congestion.  Get adequate rest.   May use Afrin nasal spray (or generic oxymetazoline) each morning for about 5 days and then discontinue.  Also recommend using saline nasal spray several times daily and saline nasal irrigation (AYR is a common brand).    Try warm salt water gargles for sore throat.  Stop all antihistamines for now (Nyquil, etc), and other non-prescription cough/cold preparations. May take Delsym Cough Suppressant with Tessalon (benzonatate) at bedtime for nighttime cough.

## 2018-04-25 NOTE — ED Provider Notes (Signed)
[facility]    CSN: [id number] Arrival date & time: 04/25/18  1216     History   Chief Complaint Chief Complaint  Patient presents with  . Cough    HPI Crystal Clements is a 27 y.o. female.   One week ago patient deveoped typical cold-like symptoms developing over several days, including mild sore throat, sinus congestion, fatigue, low grade fever, and cough.  She has now developed wheezing, sensation of shortness of breath, and tightness in her chest.  Her cough is generally non-productive, and she often gags when she coughs. Her symptoms have not responded to Daquil, Nyquil, and Mucinex.  The history is provided by the patient.    Past Medical History:  Diagnosis Date  . ADD (attention deficit disorder with hyperactivity)     Patient Active Problem List   Diagnosis Date Noted  . 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 Morbid obesity (HCC) 09/06/2016  . Screening for malignant neoplasm of cervix 10/12/2010  . General medical examination 09/15/2010  . Attention deficit disorder 10/29/2009    Past Surgical History:  Procedure Laterality Date  . BLADDER NECK RECONSTRUCTION N/A 01/23/2017   Procedure: BLADDER REPAIR;  Surgeon: Alfredo Martinez, MD;  Location: [facility];  Service: Urology;  Laterality: N/A;  . FOREIGN BODY RETRIEVAL N/A 01/23/2017   Procedure: FOREIGN BODY RETRIEVAL;  Surgeon: Alfredo Martinez, MD;  Location: [facility];  Service: Urology;  Laterality: N/A;  . LAPAROSCOPIC ABDOMINAL EXPLORATION  01/23/2017   Procedure: LAPAROSCOPIC ABDOMINAL EXPLORATION;  Surgeon: Alfredo Martinez, MD;  Location: [facility];  Service: Urology;;    OB History   No obstetric history on file.      Home Medications    Prior to Admission medications   Medication Sig Start Date End Date Taking? Authorizing Provider  dextromethorphan-guaiFENesin (MUCINEX DM) 30-600 MG 12hr tablet Take 1 tablet by mouth 2 (two) times daily.   Yes [provider]    oxymetazoline (AFRIN) 0.05 % nasal spray Place 1 spray into both nostrils 2 (two) times daily.   Yes [provider]  benzonatate (TESSALON) 200 MG capsule Take one cap by mouth at bedtime as needed for cough.  May repeat in 4 to 6 hours 04/25/18   Lattie Haw, MD  Cholecalciferol (VITAMIN D-3) 1000 units CAPS Take 1 capsule by mouth daily.    [provider]  fluconazole (DIFLUCAN) 150 MG tablet Take one tab by mouth once for yeast infection 04/25/18   Lattie Haw, MD  levofloxacin (LEVAQUIN) 750 MG tablet Take 1 tablet (750 mg total) by mouth daily for 7 days. 04/25/18 05/02/18  Lattie Haw, MD  methylphenidate (RITALIN) 10 MG tablet Take 1 tablet (10 mg total) by mouth 3 (three) times daily. 04/20/18 04/20/19  Sheliah Hatch, MD  MULTIPLE VITAMIN PO Take 1 tablet by mouth daily.    [provider]  norethindrone-ethinyl estradiol (MICROGESTIN,JUNEL,LOESTRIN) 1-20 MG-MCG tablet Take 1 tablet by mouth daily. 09/23/17   Sheliah Hatch, MD  predniSONE (DELTASONE) 20 MG tablet Take one tab by mouth twice daily for 4 days, then one daily. Take with food. 04/25/18   Lattie Haw, MD    Family History Family History  Problem Relation Age of Onset  . Breast cancer Maternal Grandmother        62s  . Pancreatic cancer Maternal Grandmother   . Lung cancer Maternal Aunt        smoker    Social History Social History  Tobacco Use  . Smoking status: Never Smoker  . Smokeless tobacco: Never Used  Substance Use Topics  . Alcohol use: Yes    Alcohol/week: 3.0 standard drinks    Types: 3 Standard drinks or equivalent per week    Comment: occ.  . Drug use: No     Allergies   Patient has no known allergies.   Review of Systems Review of Systems + sore throat + cough ? pleuritic pain + wheezing + nasal congestion + post-nasal drainage No sinus pain/pressure No itchy/red eyes No earache No hemoptysis + SOB + fever, + chills No  nausea No vomiting No abdominal pain No diarrhea No urinary symptoms No skin rash + fatigue No myalgias No headache Used OTC meds without relief   Physical Exam Triage Vital Signs ED Triage Vitals  Enc Vitals Group     BP 04/25/18 1343 (!) 157/105     Pulse Rate 04/25/18 1343 95     Resp --      Temp 04/25/18 1343 98 F (36.7 C)     Temp Source 04/25/18 1343 Oral     SpO2 04/25/18 1343 96 %     Weight 04/25/18 1344 285 lb (129.3 kg)     Height 04/25/18 1344 5\' 7"  (1.702 m)     Head Circumference --      Peak Flow --      Pain Score 04/25/18 1344 3     Pain Loc --      Pain Edu? --      Excl. in GC? --    No data found.  Updated Vital Signs BP (!) 157/105 (BP Location: Right Arm)   Pulse 95   Temp 98 F (36.7 C) (Oral)   Ht 5\' 7"  (1.702 m)   Wt 129.3 kg   LMP 03/25/2018   SpO2 96%   BMI 44.64 kg/m   Visual Acuity Right Eye Distance:   Left Eye Distance:   Bilateral Distance:    Right Eye Near:   Left Eye Near:    Bilateral Near:     Physical Exam Nursing notes and Vital Signs reviewed. Appearance:  Patient appears stated age, and in no acute distress Eyes:  Pupils are equal, round, and reactive to light and accomodation.  Extraocular movement is intact.  Conjunctivae are not inflamed  Ears:  Canals normal.  Tympanic membranes normal.  Nose:  Mildly congested turbinates.  No sinus tenderness. Neck:  Supple.  Enlarged posterior/lateral nodes are palpated bilaterally, tender to palpation on the left.   Lungs:  Faint scattered wheezes posteriorlyl  Breath sounds are equal.  Moving air well.  Post neb treatment, decreased wheezes present. Heart:  Regular rate and rhythm without murmurs, rubs, or gallops.  Abdomen:  Nontender without masses or hepatosplenomegaly.  Bowel sounds are present.  No CVA or flank tenderness.  Extremities:  No edema.  Skin:  No rash present.    UC Treatments / Results  Labs (all labs ordered are listed, but only abnormal results  are displayed) Labs Reviewed - No data to display  EKG None  Radiology Dg Chest 2 View  Result Date: 04/25/2018 CLINICAL DATA:  Shortness of breath and cough EXAM: CHEST - 2 VIEW COMPARISON:  June 09, 2009 FINDINGS: There is airspace opacity in the apical segment of the left upper lobe consistent with pneumonia. The lungs elsewhere are clear. Heart size and pulmonary vascularity are normal. No adenopathy. No bone lesions. IMPRESSION: Apical segment left upper lobe pneumonia.  Lungs elsewhere clear. No adenopathy evident. These results will be called to the ordering clinician or representative by the Radiologist Assistant, and communication documented in the PACS or zVision Dashboard. Electronically Signed   By: Bretta BangWilliam  Woodruff III M.D.   On: 04/25/2018 14:18    Procedures Procedures (including critical care time)  Medications Ordered in UC Medications  ipratropium-albuterol (DUONEB) 0.5-2.5 (3) MG/3ML nebulizer solution 3 mL (3 mLs Nebulization Given 04/25/18 1445)    Initial Impression / Assessment and Plan / UC Course  I have reviewed the triage vital signs and the nursing notes.  Pertinent labs & imaging results that were available during my care of the patient were reviewed by me and considered in my medical decision making (see chart for details).    Administered DuoNeb by hand held nebulizer. Begin Levaquin and prednisone burst/taper.  Given Rx for Diflucan at patient's request. Prescription written for Benzonatate [facility](Tessalon) to take at bedtime for night-time cough.  Followup with Family Doctor if not improved in one week.    Final Clinical Impressions(s) / UC Diagnoses   Final diagnoses:  Pneumonia of left upper lobe due to infectious organism [facility](HCC)     Discharge Instructions     Take plain guaifenesin (1200mg  extended release tabs such as Mucinex) twice daily, with plenty of water, for cough and congestion.  May add Pseudoephedrine (30mg , one or two every 4 to 6  hours) for sinus congestion.  Get adequate rest.   May use Afrin nasal spray (or generic oxymetazoline) each morning for about 5 days and then discontinue.  Also recommend using saline nasal spray several times daily and saline nasal irrigation (AYR is a common brand).    Try warm salt water gargles for sore throat.  Stop all antihistamines for now (Nyquil, etc), and other non-prescription cough/cold preparations. May take Delsym Cough Suppressant with Tessalon (benzonatate) at bedtime for nighttime cough.       ED Prescriptions    Medication Sig Dispense Auth. Provider   predniSONE (DELTASONE) 20 MG tablet Take one tab by mouth twice daily for 4 days, then one daily. Take with food. 12 tablet Lattie HawBeese, Renley Banwart A, MD   levofloxacin (LEVAQUIN) 750 MG tablet Take 1 tablet (750 mg total) by mouth daily for 7 days. 7 tablet Lattie HawBeese, Sy Saintjean A, MD   benzonatate (TESSALON) 200 MG capsule Take one cap by mouth at bedtime as needed for cough.  May repeat in 4 to 6 hours 15 capsule Lattie HawBeese, Brenlynn Fake A, MD   fluconazole (DIFLUCAN) 150 MG tablet Take one tab by mouth once for yeast infection 1 tablet Lattie HawBeese, Rondi Ivy A, MD        Lattie HawBeese, Nikiah Goin A, MD 05/06/18 [phone]

## 2018-05-19 ENCOUNTER — Other Ambulatory Visit: Payer: Self-pay | Admitting: Family Medicine

## 2018-05-19 DIAGNOSIS — F988 Other specified behavioral and emotional disorders with onset usually occurring in childhood and adolescence: Secondary | ICD-10-CM

## 2018-05-19 MED ORDER — METHYLPHENIDATE HCL 10 MG PO TABS: 10.0000 mg | ORAL_TABLET | Freq: Three times a day (TID) | ORAL | 0 refills | Status: DC

## 2018-05-19 NOTE — Telephone Encounter (Signed)
Last OV 09/23/17 Ritalin last filled 04/20/18 #90 with 0

## 2018-05-31 ENCOUNTER — Telehealth: Payer: BLUE CROSS/BLUE SHIELD | Admitting: Nurse Practitioner

## 2018-05-31 DIAGNOSIS — J029 Acute pharyngitis, unspecified: Secondary | ICD-10-CM | POA: Diagnosis not present

## 2018-05-31 MED ORDER — AMOXICILLIN-POT CLAVULANATE 875-125 MG PO TABS: 1.0000 | ORAL_TABLET | Freq: Two times a day (BID) | ORAL | 0 refills | Status: DC

## 2018-05-31 NOTE — Progress Notes (Signed)
We are sorry that you are not feeling well.  Here is how we plan to help!  5 minutes spent reviewing and documenting in chart.   Based on what you have shared with me it is likely that you possibly strep pharyngitis.  Strep pharyngitis is inflammation and infection in the back of the throat.  This is an infection cause by bacteria and is treated with antibiotics.  I have prescribed Augmentin 875 mg/ 125 mg one tablet twice daily for 7 days. For throat pain, we recommend over the counter oral pain relief medications such as acetaminophen or aspirin, or anti-inflammatory medications such as ibuprofen or naproxen sodium. Topical treatments such as oral throat lozenges or sprays may be used as needed. Strep infections are not as easily transmitted as other respiratory infections, however we still recommend that you avoid close contact with loved ones, especially the very young and elderly.  Remember to wash your hands thoroughly throughout the day as this is the number one way to prevent the spread of infection and wipe down door knobs and counters with disinfectant.   Home Care:  Only take medications as instructed by your medical team.  Complete the entire course of an antibiotic.  Do not take these medications with alcohol.  A steam or ultrasonic humidifier can help congestion.  You can place a towel over your head and breathe in the steam from hot water coming from a faucet.  Avoid close contacts especially the very young and the elderly.  Cover your mouth when you cough or sneeze.  Always remember to wash your hands.  Get Help Right Away If:  You develop worsening fever or sinus pain.  You develop a severe head ache or visual changes.  Your symptoms persist after you have completed your treatment plan.  Make sure you  Understand these instructions.  Will watch your condition.  Will get help right away if you are not doing well or get worse.  Your e-visit answers were reviewed  by a board certified advanced clinical practitioner to complete your personal care plan.  Depending on the condition, your plan could have included both over the counter or prescription medications.  If there is a problem please reply  once you have received a response from your provider.  Your safety is important to [place].  If you have drug allergies check your prescription carefully.    You can use MyChart to ask questions about today's visit, request a non-urgent call back, or ask for a work or school excuse for 24 hours related to this e-Visit. If it has been greater than 24 hours you will need to follow up with your provider, or enter a new e-Visit to address those concerns.  You will get an e-mail in the next two days asking about your experience.  I hope that your e-visit has been valuable and will speed your recovery. Thank you for using e-visits.

## 2018-06-19 ENCOUNTER — Other Ambulatory Visit: Payer: Self-pay | Admitting: Family Medicine

## 2018-06-19 DIAGNOSIS — F988 Other specified behavioral and emotional disorders with onset usually occurring in childhood and adolescence: Secondary | ICD-10-CM

## 2018-06-19 MED ORDER — METHYLPHENIDATE HCL 10 MG PO TABS: 10.0000 mg | ORAL_TABLET | Freq: Three times a day (TID) | ORAL | 0 refills | Status: DC

## 2018-06-19 NOTE — Telephone Encounter (Signed)
Last refill:05/19/18 #90, 0 Last OV:09/23/17

## 2018-07-17 ENCOUNTER — Other Ambulatory Visit: Payer: Self-pay | Admitting: Family Medicine

## 2018-07-17 DIAGNOSIS — F988 Other specified behavioral and emotional disorders with onset usually occurring in childhood and adolescence: Secondary | ICD-10-CM

## 2018-07-18 NOTE — Telephone Encounter (Signed)
Last OV 09/23/17 Ritalin last filled 06/19/18 #90 with 0

## 2018-07-19 ENCOUNTER — Other Ambulatory Visit: Payer: Self-pay | Admitting: Family Medicine

## 2018-07-19 DIAGNOSIS — F988 Other specified behavioral and emotional disorders with onset usually occurring in childhood and adolescence: Secondary | ICD-10-CM

## 2018-07-19 MED ORDER — METHYLPHENIDATE HCL 10 MG PO TABS: 10.0000 mg | ORAL_TABLET | Freq: Three times a day (TID) | ORAL | 0 refills | Status: DC

## 2018-07-19 NOTE — Telephone Encounter (Signed)
Appt has been made for 10/18/2018

## 2018-07-19 NOTE — Telephone Encounter (Signed)
Please have pt schedule CPE for sometime in June

## 2018-07-19 NOTE — Telephone Encounter (Signed)
Concerta last rx 06/19/18 #90  LOV: 09/23/17 CPE NARX report 05/27/17  Please advise

## 2018-08-16 ENCOUNTER — Other Ambulatory Visit: Payer: Self-pay | Admitting: Family Medicine

## 2018-08-16 DIAGNOSIS — F988 Other specified behavioral and emotional disorders with onset usually occurring in childhood and adolescence: Secondary | ICD-10-CM

## 2018-08-16 MED ORDER — METHYLPHENIDATE HCL 10 MG PO TABS: 10.0000 mg | ORAL_TABLET | Freq: Three times a day (TID) | ORAL | 0 refills | Status: DC

## 2018-08-16 NOTE — Telephone Encounter (Signed)
Last refill:07/19/18 #90, 0 Last OV:09/23/17

## 2018-08-16 NOTE — Telephone Encounter (Signed)
Will refill this month but needs to schedule an appt for ongoing refills

## 2018-09-15 ENCOUNTER — Other Ambulatory Visit: Payer: Self-pay | Admitting: Family Medicine

## 2018-09-15 DIAGNOSIS — F988 Other specified behavioral and emotional disorders with onset usually occurring in childhood and adolescence: Secondary | ICD-10-CM

## 2018-09-15 MED ORDER — METHYLPHENIDATE HCL 10 MG PO TABS: 10.0000 mg | ORAL_TABLET | Freq: Three times a day (TID) | ORAL | 0 refills | Status: DC

## 2018-09-15 NOTE — Telephone Encounter (Signed)
Ritalin last rx 08/16/18 #90 LOV: 09/23/17 CPE

## 2018-10-13 ENCOUNTER — Other Ambulatory Visit: Payer: Self-pay | Admitting: Family Medicine

## 2018-10-13 DIAGNOSIS — F988 Other specified behavioral and emotional disorders with onset usually occurring in childhood and adolescence: Secondary | ICD-10-CM

## 2018-10-13 NOTE — Telephone Encounter (Signed)
Defer to PCP on he return as this is not urgent medication and is controlled substance.

## 2018-10-13 NOTE — Telephone Encounter (Signed)
Ritalin last rx 09/15/18 #90 LOV: 09/23/17 CPE CSC: 04/18/2012 Next appt: 10/18/18  Please advise in PCP absence

## 2018-10-16 MED ORDER — METHYLPHENIDATE HCL 10 MG PO TABS: 10.0000 mg | ORAL_TABLET | Freq: Three times a day (TID) | ORAL | 0 refills | Status: DC

## 2018-10-18 ENCOUNTER — Encounter: Payer: BLUE CROSS/BLUE SHIELD | Admitting: Family Medicine

## 2018-10-18 ENCOUNTER — Ambulatory Visit (INDEPENDENT_AMBULATORY_CARE_PROVIDER_SITE_OTHER): Payer: BC Managed Care – PPO | Admitting: Family Medicine

## 2018-10-18 ENCOUNTER — Other Ambulatory Visit: Payer: Self-pay

## 2018-10-18 ENCOUNTER — Encounter: Payer: Self-pay | Admitting: Family Medicine

## 2018-10-18 VITALS — BP 121/84 | HR 111 | Temp 99.5°F | Resp 16 | Ht 67.0 in | Wt 275.5 lb

## 2018-10-18 DIAGNOSIS — Z0000 Encounter for general adult medical examination without abnormal findings: Secondary | ICD-10-CM | POA: Diagnosis not present

## 2018-10-18 LAB — CBC WITH DIFFERENTIAL/PLATELET
Basophils Absolute: 0.1 10*3/uL (ref 0.0–0.1)
Basophils Relative: 0.5 % (ref 0.0–3.0)
Eosinophils Absolute: 0.2 10*3/uL (ref 0.0–0.7)
Eosinophils Relative: 1.6 % (ref 0.0–5.0)
HCT: 44.5 % (ref 36.0–46.0)
Hemoglobin: 14 g/dL (ref 12.0–15.0)
Lymphocytes Relative: 24.9 % (ref 12.0–46.0)
Lymphs Abs: 3.1 10*3/uL (ref 0.7–4.0)
MCHC: 31.6 g/dL (ref 30.0–36.0)
MCV: 83.4 fl (ref 78.0–100.0)
Monocytes Absolute: 0.6 10*3/uL (ref 0.1–1.0)
Monocytes Relative: 5 % (ref 3.0–12.0)
Neutro Abs: 8.5 10*3/uL — ABNORMAL HIGH (ref 1.4–7.7)
Neutrophils Relative %: 68 % (ref 43.0–77.0)
Platelets: 291 10*3/uL (ref 150.0–400.0)
RBC: 5.34 Mil/uL — ABNORMAL HIGH (ref 3.87–5.11)
RDW: 16.4 % — ABNORMAL HIGH (ref 11.5–15.5)
WBC: 12.5 10*3/uL — ABNORMAL HIGH (ref 4.0–10.5)

## 2018-10-18 LAB — BASIC METABOLIC PANEL
BUN: 8 mg/dL (ref 6–23)
CO2: 26 mEq/L (ref 19–32)
Calcium: 8.7 mg/dL (ref 8.4–10.5)
Chloride: 103 mEq/L (ref 96–112)
Creatinine, Ser: 0.69 mg/dL (ref 0.40–1.20)
GFR: 100.13 mL/min (ref >60.00)
Glucose, Bld: 93 mg/dL (ref 70–99)
Potassium: 4.7 mEq/L (ref 3.5–5.1)
Sodium: 139 mEq/L (ref 135–145)

## 2018-10-18 LAB — LIPID PANEL
Cholesterol: 211 mg/dL — ABNORMAL HIGH (ref 0–200)
HDL: 55.1 mg/dL (ref >39.00)
LDL Cholesterol: 117 mg/dL — ABNORMAL HIGH (ref 0–99)
NonHDL: 155.56
Total CHOL/HDL Ratio: 4
Triglycerides: 195 mg/dL — ABNORMAL HIGH (ref 0.0–149.0)
VLDL: 39 mg/dL (ref 0.0–40.0)

## 2018-10-18 LAB — TSH: TSH: 2.63 u[IU]/mL (ref 0.35–4.50)

## 2018-10-18 LAB — HEPATIC FUNCTION PANEL
ALT: 14 U/L (ref 0–35)
AST: 18 U/L (ref 0–37)
Albumin: 4.1 g/dL (ref 3.5–5.2)
Alkaline Phosphatase: 86 U/L (ref 39–117)
Bilirubin, Direct: 0.1 mg/dL (ref 0.0–0.3)
Total Bilirubin: 0.6 mg/dL (ref 0.2–1.2)
Total Protein: 7 g/dL (ref 6.0–8.3)

## 2018-10-18 NOTE — Progress Notes (Signed)
   Subjective:    Patient ID: Crystal Clements, female    DOB: [birth year], 28 y.o.   MRN: [id number]  HPI CPE- UTD on pap, immunizations.  Pt is down 8 lbs from previous visit.   Review of Systems Patient reports no vision/ hearing changes, adenopathy,fever, weight change,  persistant/recurrent hoarseness , swallowing issues, chest pain, palpitations, edema, persistant/recurrent cough, hemoptysis, dyspnea (rest/exertional/paroxysmal nocturnal), gastrointestinal bleeding (melena, rectal bleeding), abdominal pain, significant heartburn, bowel changes, GU symptoms (dysuria, hematuria, incontinence), Gyn symptoms (abnormal  bleeding, pain),  syncope, focal weakness, memory loss, numbness & tingling, skin/hair/nail changes, abnormal bruising or bleeding.  + adjustment disorder- ended relationship in January and he killed himself in Feb.  Pt reports she is doing better.  Seeing someone again.    Objective:   Physical Exam General Appearance:    Alert, cooperative, no distress, appears stated age, obese  Head:    Normocephalic, without obvious abnormality, atraumatic  Eyes:    PERRL, conjunctiva/corneas clear, EOM's intact, fundi    benign, both eyes  Ears:    Normal TM's and external ear canals, both ears  Nose:   Nares normal, septum midline, mucosa normal, no drainage    or sinus tenderness  Throat:   Lips, mucosa, and tongue normal; teeth and gums normal  Neck:   Supple, symmetrical, trachea midline, no adenopathy;    Thyroid: no enlargement/tenderness/nodules  Back:     Symmetric, no curvature, ROM normal, no CVA tenderness  Lungs:     Clear to auscultation bilaterally, respirations unlabored  Chest Wall:    No tenderness or deformity   Heart:    Regular rate and rhythm, S1 and S2 normal, no murmur, rub   or gallop  Breast Exam:    Deferred to GYN  Abdomen:     Soft, non-tender, bowel sounds active all four quadrants,    no masses, no organomegaly  Genitalia:    Deferred to GYN  Rectal:     Extremities:   Extremities normal, atraumatic, no cyanosis or edema  Pulses:   2+ and symmetric all extremities  Skin:   Skin color, texture, turgor normal, no rashes or lesions  Lymph nodes:   Cervical, supraclavicular, and axillary nodes normal  Neurologic:   CNII-XII intact, normal strength, sensation and reflexes    throughout          Assessment & Plan:

## 2018-10-18 NOTE — Patient Instructions (Signed)
Follow up in 1 year or as needed We'll notify you of your lab results and make any changes if needed Continue to work on healthy diet and regular exercise- you're doing great!!! Call with any questions or concerns Stay Safe!! [place] in there!!!

## 2018-10-18 NOTE — Assessment & Plan Note (Signed)
Pt is down 8 lbs from last visit.  Applauded her efforts and encouraged her to continue.  Check labs to risk stratify.  Will follow.

## 2018-10-18 NOTE — Assessment & Plan Note (Signed)
Pt's PE WNL w/ exception of obesity.  UTD on GYN, immunizations.  Check labs.  Anticipatory guidance provided.  °

## 2018-10-19 ENCOUNTER — Encounter: Payer: Self-pay | Admitting: General Practice

## 2018-11-14 ENCOUNTER — Other Ambulatory Visit: Payer: Self-pay | Admitting: Family Medicine

## 2018-11-14 DIAGNOSIS — F988 Other specified behavioral and emotional disorders with onset usually occurring in childhood and adolescence: Secondary | ICD-10-CM

## 2018-11-14 MED ORDER — METHYLPHENIDATE HCL 10 MG PO TABS: 10.0000 mg | ORAL_TABLET | Freq: Three times a day (TID) | ORAL | 0 refills | Status: DC

## 2018-11-14 NOTE — Telephone Encounter (Signed)
Last OV 10/18/18 ritalin last filled 10/16/18 #90 with 0

## 2018-12-13 ENCOUNTER — Other Ambulatory Visit: Payer: Self-pay | Admitting: Family Medicine

## 2018-12-13 DIAGNOSIS — F988 Other specified behavioral and emotional disorders with onset usually occurring in childhood and adolescence: Secondary | ICD-10-CM

## 2018-12-13 MED ORDER — METHYLPHENIDATE HCL 10 MG PO TABS: 10.0000 mg | ORAL_TABLET | Freq: Three times a day (TID) | ORAL | 0 refills | Status: DC

## 2018-12-13 NOTE — Telephone Encounter (Signed)
Last OV 10/18/18 Ritalin last filled 11/14/18 #90 with 0

## 2019-01-11 ENCOUNTER — Other Ambulatory Visit: Payer: Self-pay | Admitting: Family Medicine

## 2019-01-11 DIAGNOSIS — F988 Other specified behavioral and emotional disorders with onset usually occurring in childhood and adolescence: Secondary | ICD-10-CM

## 2019-01-11 MED ORDER — METHYLPHENIDATE HCL 10 MG PO TABS: 10.0000 mg | ORAL_TABLET | Freq: Three times a day (TID) | ORAL | 0 refills | Status: DC

## 2019-01-11 NOTE — Telephone Encounter (Signed)
Last OV 10/18/18 Ritalin last filled 12/13/18 #90 with 0

## 2019-02-09 ENCOUNTER — Other Ambulatory Visit: Payer: Self-pay | Admitting: Family Medicine

## 2019-02-09 DIAGNOSIS — F988 Other specified behavioral and emotional disorders with onset usually occurring in childhood and adolescence: Secondary | ICD-10-CM

## 2019-02-09 NOTE — Telephone Encounter (Signed)
Last OV 10/18/18 Ritalin last filled 01/11/19 #90 with 0

## 2019-02-11 IMAGING — CR DG ABDOMEN 1V
2 series · 2 of 2 positions shown · non-contrast
Comparison: None.

CLINICAL DATA: Foreign object placed in urethra.

EXAM:
ABDOMEN - 1 VIEW

[t abdomen supine (1 of 2)]
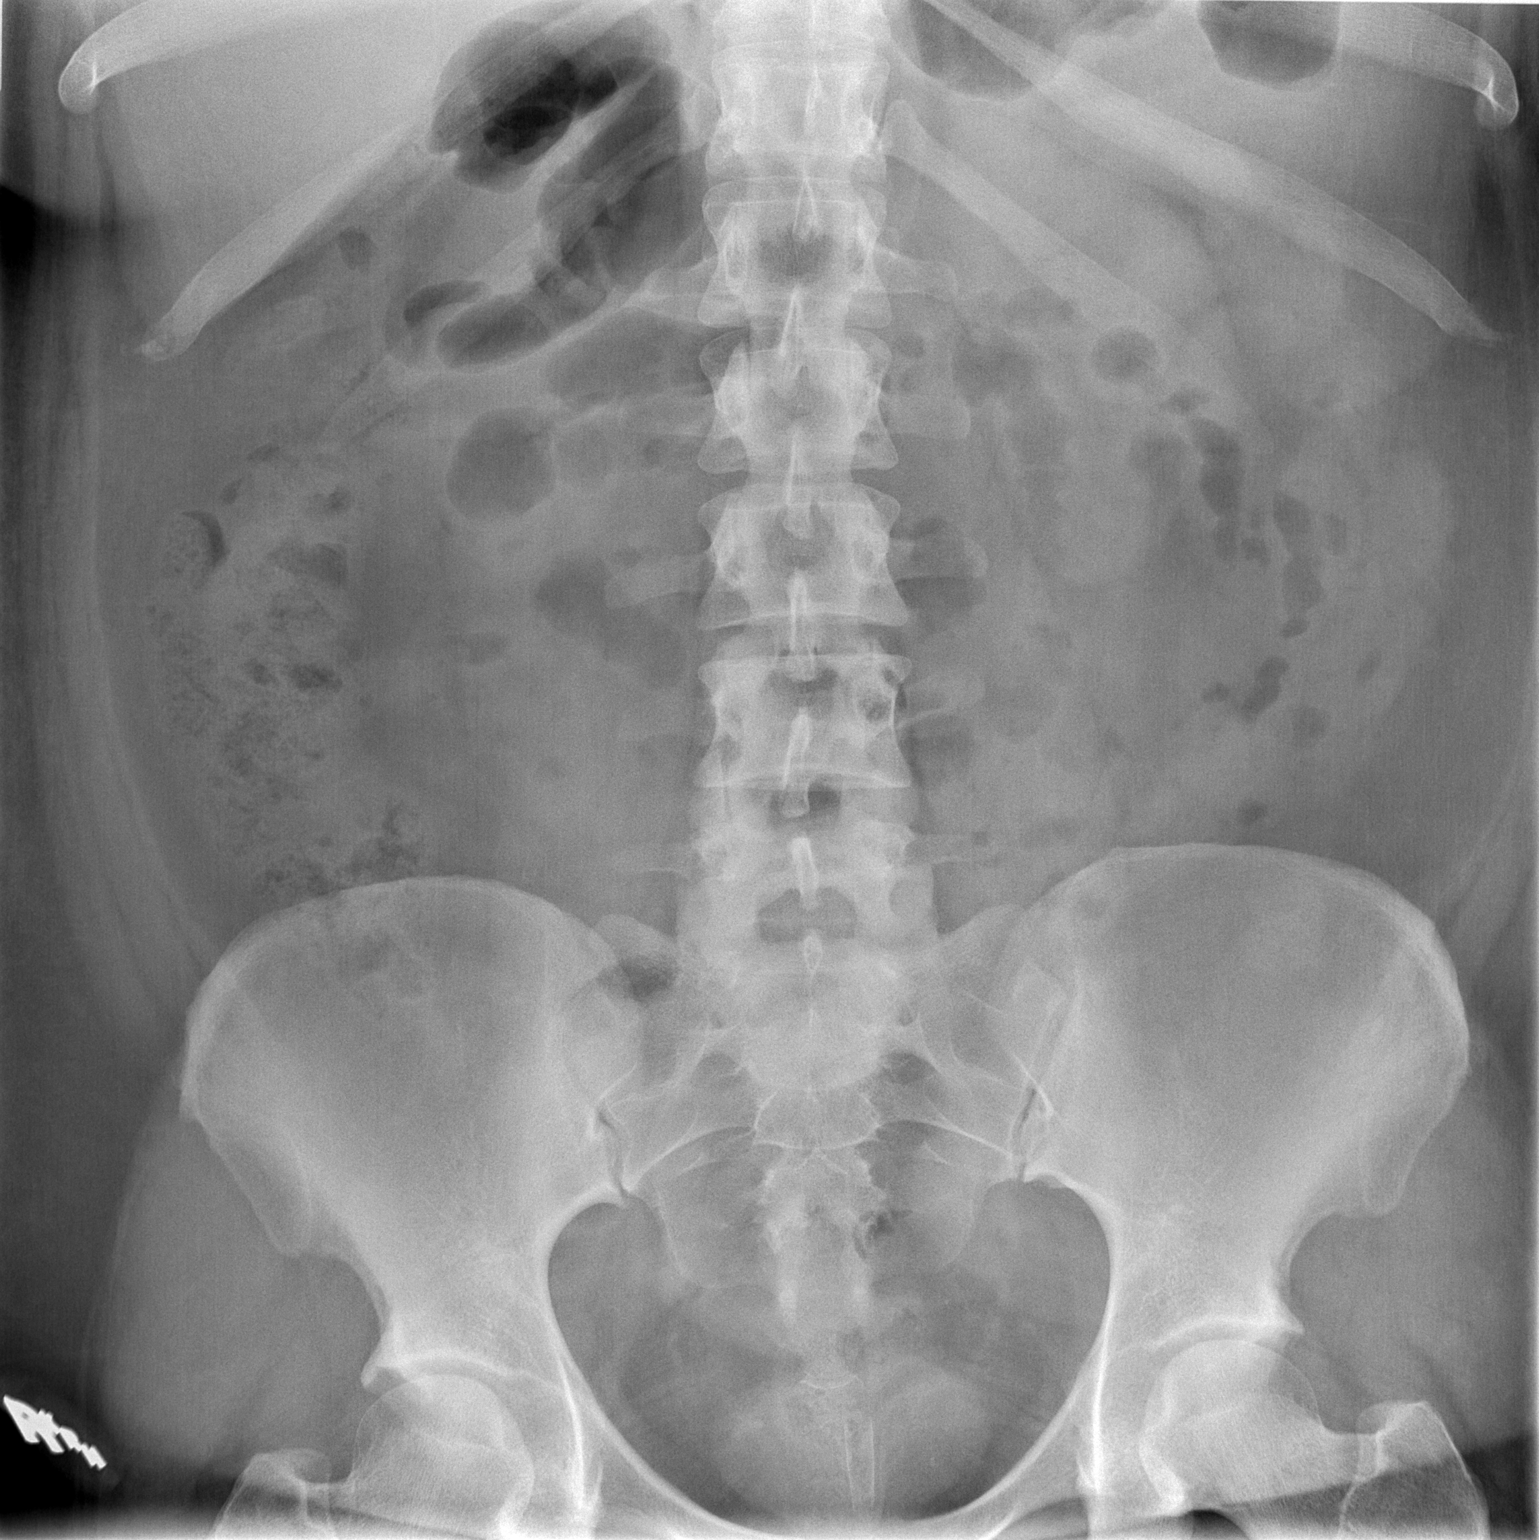

[t abdomen supine (2 of 2)]
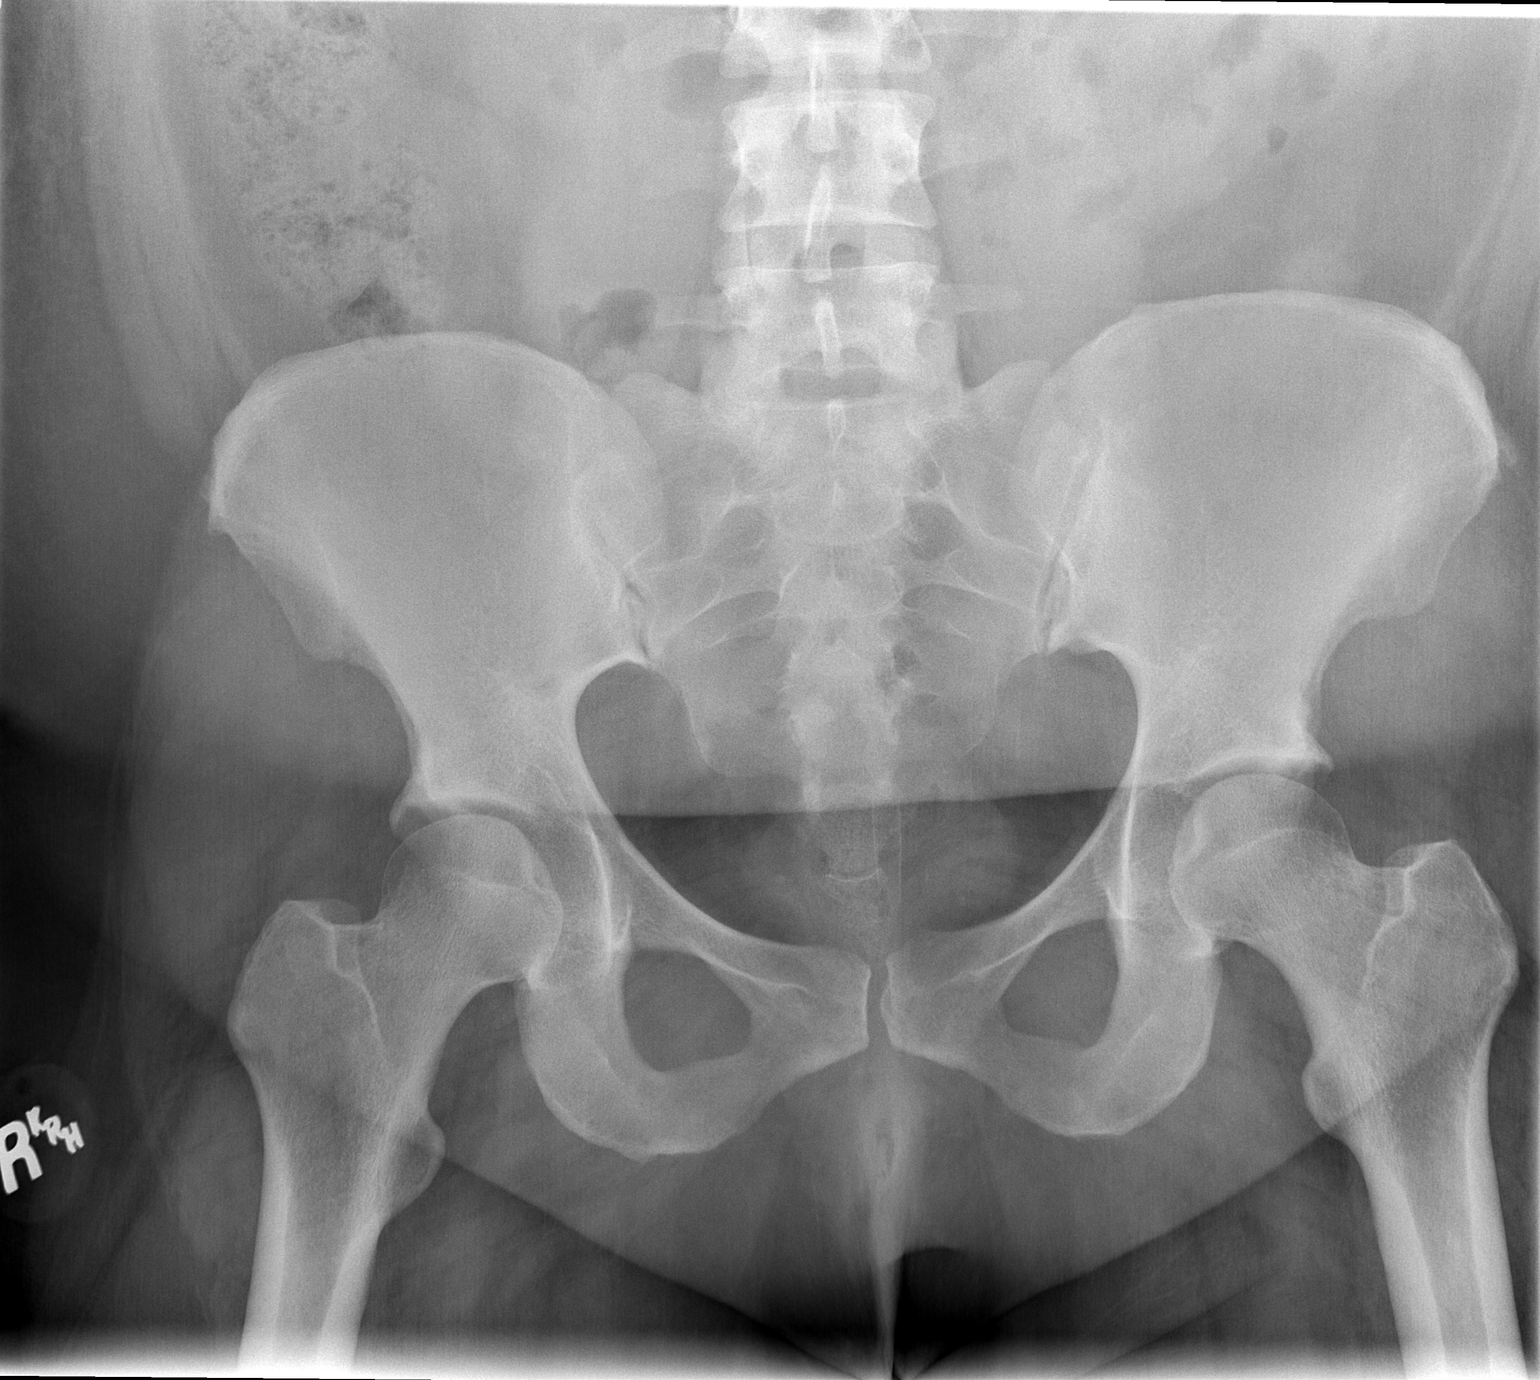

[2 of 2 positions shown; findings below may reference images not displayed]

FINDINGS: There are two parallel linear opacities overlying the lower pelvis.
Bowel gas pattern is normal. No other abnormality.
IMPRESSION: Two parallel all linear opacities overlying the lower pelvis. This
could indicate a foreign body within the urinary bladder. CT or
cystoscopy may be helpful.

## 2019-02-12 MED ORDER — METHYLPHENIDATE HCL 10 MG PO TABS: 10.0000 mg | ORAL_TABLET | Freq: Three times a day (TID) | ORAL | 0 refills | Status: DC

## 2019-02-13 DIAGNOSIS — Z20828 Contact with and (suspected) exposure to other viral communicable diseases: Secondary | ICD-10-CM | POA: Diagnosis not present

## 2019-03-12 ENCOUNTER — Other Ambulatory Visit: Payer: Self-pay | Admitting: Family Medicine

## 2019-03-12 DIAGNOSIS — F988 Other specified behavioral and emotional disorders with onset usually occurring in childhood and adolescence: Secondary | ICD-10-CM

## 2019-03-12 MED ORDER — METHYLPHENIDATE HCL 10 MG PO TABS: 10.0000 mg | ORAL_TABLET | Freq: Three times a day (TID) | ORAL | 0 refills | Status: DC

## 2019-03-12 NOTE — Telephone Encounter (Signed)
Last OV 10/18/18 Ritalin last filled 02/12/19 #90 with 0

## 2019-04-10 ENCOUNTER — Other Ambulatory Visit: Payer: Self-pay | Admitting: Family Medicine

## 2019-04-10 DIAGNOSIS — F988 Other specified behavioral and emotional disorders with onset usually occurring in childhood and adolescence: Secondary | ICD-10-CM

## 2019-04-10 MED ORDER — METHYLPHENIDATE HCL 10 MG PO TABS: 10.0000 mg | ORAL_TABLET | Freq: Three times a day (TID) | ORAL | 0 refills | Status: DC

## 2019-04-10 NOTE — Telephone Encounter (Signed)
Ritalin tid last rx 03/12/19 #90 LOV: 10/18/18 CPE

## 2019-05-10 ENCOUNTER — Other Ambulatory Visit: Payer: Self-pay | Admitting: Family Medicine

## 2019-05-10 DIAGNOSIS — F988 Other specified behavioral and emotional disorders with onset usually occurring in childhood and adolescence: Secondary | ICD-10-CM

## 2019-05-10 MED ORDER — METHYLPHENIDATE HCL 10 MG PO TABS: 10.0000 mg | ORAL_TABLET | Freq: Three times a day (TID) | ORAL | 0 refills | Status: DC

## 2019-05-10 NOTE — Telephone Encounter (Signed)
Last OV 10/18/18 Ritalin last filled 04/10/19 #90 with 0

## 2019-06-08 ENCOUNTER — Other Ambulatory Visit: Payer: Self-pay | Admitting: General Practice

## 2019-06-08 ENCOUNTER — Other Ambulatory Visit: Payer: Self-pay | Admitting: Family Medicine

## 2019-06-08 DIAGNOSIS — F988 Other specified behavioral and emotional disorders with onset usually occurring in childhood and adolescence: Secondary | ICD-10-CM

## 2019-06-08 MED ORDER — METHYLPHENIDATE HCL 10 MG PO TABS: 10.0000 mg | ORAL_TABLET | Freq: Three times a day (TID) | ORAL | 0 refills | Status: DC

## 2019-06-08 MED ORDER — NORETHINDRONE ACET-ETHINYL EST 1-20 MG-MCG PO TABS: 1.0000 | ORAL_TABLET | Freq: Every day | ORAL | 0 refills | Status: DC

## 2019-06-08 NOTE — Telephone Encounter (Signed)
Last refill: 1.14.21 #90, 0 Last OV: 6.24.20 dx. CPE

## 2019-06-25 ENCOUNTER — Encounter: Payer: Self-pay | Admitting: Family Medicine

## 2019-06-26 DIAGNOSIS — U071 COVID-19: Secondary | ICD-10-CM | POA: Diagnosis not present

## 2019-06-26 DIAGNOSIS — Z20828 Contact with and (suspected) exposure to other viral communicable diseases: Secondary | ICD-10-CM | POA: Diagnosis not present

## 2019-07-06 ENCOUNTER — Other Ambulatory Visit: Payer: Self-pay | Admitting: Family Medicine

## 2019-07-06 DIAGNOSIS — F988 Other specified behavioral and emotional disorders with onset usually occurring in childhood and adolescence: Secondary | ICD-10-CM

## 2019-07-06 NOTE — Telephone Encounter (Signed)
Last OV 10/18/18 Ritalin last filled 06/08/19 #90 with 0

## 2019-07-09 MED ORDER — METHYLPHENIDATE HCL 10 MG PO TABS: 10.0000 mg | ORAL_TABLET | Freq: Three times a day (TID) | ORAL | 0 refills | Status: DC

## 2019-08-06 ENCOUNTER — Other Ambulatory Visit: Payer: Self-pay | Admitting: Family Medicine

## 2019-08-06 DIAGNOSIS — F988 Other specified behavioral and emotional disorders with onset usually occurring in childhood and adolescence: Secondary | ICD-10-CM

## 2019-08-06 MED ORDER — METHYLPHENIDATE HCL 10 MG PO TABS: 10.0000 mg | ORAL_TABLET | Freq: Three times a day (TID) | ORAL | 0 refills | Status: DC

## 2019-08-06 NOTE — Telephone Encounter (Signed)
Last OV 10/18/18 Ritalin last filled 07/09/19 #90 with 0

## 2019-09-04 ENCOUNTER — Other Ambulatory Visit: Payer: Self-pay | Admitting: Family Medicine

## 2019-09-04 DIAGNOSIS — F988 Other specified behavioral and emotional disorders with onset usually occurring in childhood and adolescence: Secondary | ICD-10-CM

## 2019-09-04 NOTE — Telephone Encounter (Signed)
Last OV 10/18/18 Ritalin last filled 08/06/19 #90 with 0

## 2019-09-05 ENCOUNTER — Other Ambulatory Visit: Payer: Self-pay | Admitting: Family Medicine

## 2019-09-05 DIAGNOSIS — F988 Other specified behavioral and emotional disorders with onset usually occurring in childhood and adolescence: Secondary | ICD-10-CM

## 2019-09-05 MED ORDER — NORETHINDRONE ACET-ETHINYL EST 1-20 MG-MCG PO TABS: 1.0000 | ORAL_TABLET | Freq: Every day | ORAL | 0 refills | Status: DC

## 2019-09-05 MED ORDER — METHYLPHENIDATE HCL 10 MG PO TABS: 10.0000 mg | ORAL_TABLET | Freq: Three times a day (TID) | ORAL | 0 refills | Status: DC

## 2019-09-05 NOTE — Telephone Encounter (Signed)
Prescription is a duplicate. Has already been sent. System will not let me refuse this medication.

## 2019-10-05 ENCOUNTER — Other Ambulatory Visit: Payer: Self-pay | Admitting: Family Medicine

## 2019-10-05 DIAGNOSIS — F988 Other specified behavioral and emotional disorders with onset usually occurring in childhood and adolescence: Secondary | ICD-10-CM

## 2019-10-05 MED ORDER — METHYLPHENIDATE HCL 10 MG PO TABS: 10.0000 mg | ORAL_TABLET | Freq: Three times a day (TID) | ORAL | 0 refills | Status: DC

## 2019-10-05 NOTE — Telephone Encounter (Signed)
Last OV 10/18/18 Ritalin last filled 09/05/19 #90 with 0

## 2019-11-05 ENCOUNTER — Other Ambulatory Visit: Payer: Self-pay | Admitting: Family Medicine

## 2019-11-05 DIAGNOSIS — F988 Other specified behavioral and emotional disorders with onset usually occurring in childhood and adolescence: Secondary | ICD-10-CM

## 2019-11-05 MED ORDER — METHYLPHENIDATE HCL 10 MG PO TABS: 10.0000 mg | ORAL_TABLET | Freq: Three times a day (TID) | ORAL | 0 refills | Status: DC

## 2019-11-05 NOTE — Telephone Encounter (Signed)
Pt has not been seen in over a year and has no upcoming appts.

## 2019-12-05 ENCOUNTER — Other Ambulatory Visit: Payer: Self-pay | Admitting: Family Medicine

## 2019-12-05 DIAGNOSIS — F988 Other specified behavioral and emotional disorders with onset usually occurring in childhood and adolescence: Secondary | ICD-10-CM

## 2019-12-05 NOTE — Telephone Encounter (Signed)
Last OV 10/18/18 Ritalin last filled 11/05/19 #90 with 0

## 2020-03-27 ENCOUNTER — Other Ambulatory Visit: Payer: Self-pay | Admitting: Family Medicine

## 2020-03-27 DIAGNOSIS — F988 Other specified behavioral and emotional disorders with onset usually occurring in childhood and adolescence: Secondary | ICD-10-CM

## 2020-03-27 NOTE — Telephone Encounter (Signed)
LR: 11-05-2019 Qty: 90 with 0 refills  Last office visit: 10-18-2018 Upcoming appointment: 05-13-2020

## 2020-03-28 MED ORDER — NORETHINDRONE ACET-ETHINYL EST 1-20 MG-MCG PO TABS: 1.0000 | ORAL_TABLET | Freq: Every day | ORAL | 0 refills | Status: DC

## 2020-03-28 MED ORDER — METHYLPHENIDATE HCL 10 MG PO TABS: 10.0000 mg | ORAL_TABLET | Freq: Three times a day (TID) | ORAL | 0 refills | Status: DC

## 2020-05-13 ENCOUNTER — Telehealth: Payer: BC Managed Care – PPO | Admitting: Family Medicine

## 2020-06-06 ENCOUNTER — Encounter: Payer: Self-pay | Admitting: Family Medicine

## 2020-06-06 ENCOUNTER — Ambulatory Visit (INDEPENDENT_AMBULATORY_CARE_PROVIDER_SITE_OTHER): Payer: BC Managed Care – PPO | Admitting: Family Medicine

## 2020-06-06 ENCOUNTER — Other Ambulatory Visit: Payer: Self-pay

## 2020-06-06 VITALS — BP 130/80 | HR 108 | Temp 98.5°F | Resp 19 | Ht 67.0 in | Wt 307.2 lb

## 2020-06-06 DIAGNOSIS — Z0000 Encounter for general adult medical examination without abnormal findings: Secondary | ICD-10-CM | POA: Diagnosis not present

## 2020-06-06 LAB — BASIC METABOLIC PANEL
BUN: 8 mg/dL (ref 6–23)
CO2: 27 mEq/L (ref 19–32)
Calcium: 8.6 mg/dL (ref 8.4–10.5)
Chloride: 100 mEq/L (ref 96–112)
Creatinine, Ser: 0.85 mg/dL (ref 0.40–1.20)
GFR: 91.37 mL/min (ref >60.00)
Glucose, Bld: 91 mg/dL (ref 70–99)
Potassium: 3.6 mEq/L (ref 3.5–5.1)
Sodium: 139 mEq/L (ref 135–145)

## 2020-06-06 LAB — LIPID PANEL
Cholesterol: 206 mg/dL — ABNORMAL HIGH (ref 0–200)
HDL: 51.2 mg/dL (ref >39.00)
LDL Cholesterol: 116 mg/dL — ABNORMAL HIGH (ref 0–99)
NonHDL: 154.87
Total CHOL/HDL Ratio: 4
Triglycerides: 192 mg/dL — ABNORMAL HIGH (ref 0.0–149.0)
VLDL: 38.4 mg/dL (ref 0.0–40.0)

## 2020-06-06 LAB — TSH: TSH: 2.84 u[IU]/mL (ref 0.35–4.50)

## 2020-06-06 LAB — HEPATIC FUNCTION PANEL
ALT: 21 U/L (ref 0–35)
AST: 30 U/L (ref 0–37)
Albumin: 3.8 g/dL (ref 3.5–5.2)
Alkaline Phosphatase: 88 U/L (ref 39–117)
Bilirubin, Direct: 0.1 mg/dL (ref 0.0–0.3)
Total Bilirubin: 0.7 mg/dL (ref 0.2–1.2)
Total Protein: 7.3 g/dL (ref 6.0–8.3)

## 2020-06-06 LAB — CBC WITH DIFFERENTIAL/PLATELET
Basophils Absolute: 0.1 10*3/uL (ref 0.0–0.1)
Basophils Relative: 0.8 % (ref 0.0–3.0)
Eosinophils Absolute: 0.2 10*3/uL (ref 0.0–0.7)
Eosinophils Relative: 1.8 % (ref 0.0–5.0)
HCT: 41.8 % (ref 36.0–46.0)
Hemoglobin: 13.6 g/dL (ref 12.0–15.0)
Lymphocytes Relative: 22.1 % (ref 12.0–46.0)
Lymphs Abs: 2 10*3/uL (ref 0.7–4.0)
MCHC: 32.4 g/dL (ref 30.0–36.0)
MCV: 85.6 fl (ref 78.0–100.0)
Monocytes Absolute: 0.4 10*3/uL (ref 0.1–1.0)
Monocytes Relative: 4.4 % (ref 3.0–12.0)
Neutro Abs: 6.4 10*3/uL (ref 1.4–7.7)
Neutrophils Relative %: 70.9 % (ref 43.0–77.0)
Platelets: 238 10*3/uL (ref 150.0–400.0)
RBC: 4.89 Mil/uL (ref 3.87–5.11)
RDW: 14.2 % (ref 11.5–15.5)
WBC: 9.1 10*3/uL (ref 4.0–10.5)

## 2020-06-06 LAB — VITAMIN D 25 HYDROXY (VIT D DEFICIENCY, FRACTURES): VITD: 29.63 ng/mL — ABNORMAL LOW (ref 30.00–100.00)

## 2020-06-06 NOTE — Patient Instructions (Signed)
Follow up in 6 months to recheck weight loss progress We'll notify you of your lab results and make any changes if needed Continue to work on healthy diet and regular exercise- you can do it!! Call with any questions or concerns Stay Safe!  Stay Healthy!

## 2020-06-06 NOTE — Assessment & Plan Note (Signed)
Deteriorated.  Pt has gained 30 lbs since last visit.  Stressed need for healthy diet and regular exercise.  Check labs to risk stratify.  Will follow.

## 2020-06-06 NOTE — Assessment & Plan Note (Signed)
Pt's PE WNL w/ exception of obesity.  UTD on immunizations and pap smears.  Check labs.  Anticipatory guidance provided.

## 2020-06-06 NOTE — Progress Notes (Signed)
   Subjective:    Patient ID: Crystal Clements, female    DOB: 12-01-1990, 30 y.o.   MRN: [id number]  HPI CPE- UTD on pap, Tdap, Flu, COVID.  Has gained 30 lbs since last visit  Reviewed past medical, surgical, family and social histories.   Health Maintenance  Topic Date Due  . Hepatitis C Screening  Never done  . PAP SMEAR-Modifier  09/23/2020  . TETANUS/TDAP  05/25/2023  . INFLUENZA VACCINE  Completed  . COVID-19 Vaccine  Completed  . HIV Screening  Completed      Review of Systems Patient reports no vision/ hearing changes, adenopathy,fever, persistant/recurrent hoarseness , swallowing issues, chest pain, palpitations, edema, persistant/recurrent cough, hemoptysis, dyspnea (rest/exertional/paroxysmal nocturnal), gastrointestinal bleeding (melena, rectal bleeding), abdominal pain, significant heartburn, bowel changes, GU symptoms (dysuria, hematuria, incontinence), Gyn symptoms (abnormal  bleeding, pain),  syncope, focal weakness, memory loss, numbness & tingling, skin/hair/nail changes, abnormal bruising or bleeding, anxiety, or depression.   This visit occurred during the SARS-CoV-2 public health emergency.  Safety protocols were in place, including screening questions prior to the visit, additional usage of staff PPE, and extensive cleaning of exam room while observing appropriate contact time as indicated for disinfecting solutions.       Objective:   Physical Exam General Appearance:    Alert, cooperative, no distress, appears stated age, obese  Head:    Normocephalic, without obvious abnormality, atraumatic  Eyes:    PERRL, conjunctiva/corneas clear, EOM's intact, fundi    benign, both eyes  Ears:    Normal TM's and external ear canals, both ears  Nose:   Deferred due to COVID  Throat:   Neck:   Supple, symmetrical, trachea midline, no adenopathy;    Thyroid: no enlargement/tenderness/nodules  Back:     Symmetric, no curvature, ROM normal, no CVA tenderness  Lungs:      Clear to auscultation bilaterally, respirations unlabored  Chest Wall:    No tenderness or deformity   Heart:    Regular rate and rhythm, S1 and S2 normal, no murmur, rub   or gallop  Breast Exam:    Deferred  Abdomen:     Soft, non-tender, bowel sounds active all four quadrants,    no masses, no organomegaly  Genitalia:    Deferred  Rectal:    Extremities:   Extremities normal, atraumatic, no cyanosis or edema  Pulses:   2+ and symmetric all extremities  Skin:   Skin color, texture, turgor normal, no rashes or lesions  Lymph nodes:   Cervical, supraclavicular, and axillary nodes normal  Neurologic:   CNII-XII intact, normal strength, sensation and reflexes    throughout          Assessment & Plan:

## 2020-06-09 NOTE — Progress Notes (Signed)
Reviewed via Mychart.

## 2020-06-27 ENCOUNTER — Other Ambulatory Visit: Payer: Self-pay | Admitting: Family Medicine

## 2020-06-27 DIAGNOSIS — F988 Other specified behavioral and emotional disorders with onset usually occurring in childhood and adolescence: Secondary | ICD-10-CM

## 2020-06-27 MED ORDER — METHYLPHENIDATE HCL 10 MG PO TABS: 10.0000 mg | ORAL_TABLET | Freq: Three times a day (TID) | ORAL | 0 refills | Status: DC

## 2020-06-27 NOTE — Telephone Encounter (Signed)
Requesting:Ritalin 10mg  Contract: UDS: Last Visit:06/06/20 Next Visit:n/a Last Refill:03/28/20 90 tabs 0 refills Please Advise

## 2020-07-20 ENCOUNTER — Other Ambulatory Visit: Payer: Self-pay | Admitting: Family Medicine

## 2020-09-04 ENCOUNTER — Other Ambulatory Visit: Payer: Self-pay | Admitting: Family Medicine

## 2020-09-04 DIAGNOSIS — F988 Other specified behavioral and emotional disorders with onset usually occurring in childhood and adolescence: Secondary | ICD-10-CM

## 2020-09-04 MED ORDER — METHYLPHENIDATE HCL 10 MG PO TABS
10.0000 mg | ORAL_TABLET | Freq: Three times a day (TID) | ORAL | 0 refills | Status: DC
Start: 2020-09-04 — End: 2020-09-09

## 2020-09-04 NOTE — Telephone Encounter (Signed)
LFD 06/27/20 #90 with no refills LOV 06/06/20 NOV none

## 2020-09-08 ENCOUNTER — Telehealth: Payer: Self-pay | Admitting: Family Medicine

## 2020-09-08 DIAGNOSIS — F988 Other specified behavioral and emotional disorders with onset usually occurring in childhood and adolescence: Secondary | ICD-10-CM

## 2020-09-08 NOTE — Telephone Encounter (Signed)
Patient called back wanting a paper script if you can not call prescription in today.  She is afraid that [facility] will not [facility] if she waits to long

## 2020-09-08 NOTE — Telephone Encounter (Signed)
Patients methyphenidate was sent in last week to [facility] and they did not have medication.  [facility] [address] [place].  Please advise

## 2020-09-09 MED ORDER — METHYLPHENIDATE HCL 10 MG PO TABS: 10.0000 mg | ORAL_TABLET | Freq: Three times a day (TID) | ORAL | 0 refills | Status: DC

## 2020-09-09 NOTE — Telephone Encounter (Signed)
Please apologize for the delay as I was not able to log into my computer yesterday afternoon due to technology issues.  Prescription has been sent.

## 2020-09-09 NOTE — Telephone Encounter (Signed)
Called patient to inform

## 2020-10-13 ENCOUNTER — Other Ambulatory Visit: Payer: Self-pay | Admitting: Family Medicine

## 2020-10-13 DIAGNOSIS — F988 Other specified behavioral and emotional disorders with onset usually occurring in childhood and adolescence: Secondary | ICD-10-CM

## 2020-10-13 MED ORDER — METHYLPHENIDATE HCL 10 MG PO TABS: 10.0000 mg | ORAL_TABLET | Freq: Three times a day (TID) | ORAL | 0 refills | Status: DC

## 2020-10-13 NOTE — Telephone Encounter (Signed)
LFD 09/09/20 #90 with no refills LOV 06/06/20 NOV none

## 2020-10-14 ENCOUNTER — Telehealth: Payer: Self-pay | Admitting: Family Medicine

## 2020-10-14 DIAGNOSIS — F988 Other specified behavioral and emotional disorders with onset usually occurring in childhood and adolescence: Secondary | ICD-10-CM

## 2020-10-14 MED ORDER — METHYLPHENIDATE HCL 10 MG PO TABS: 10.0000 mg | ORAL_TABLET | Freq: Three times a day (TID) | ORAL | 0 refills | Status: DC

## 2020-10-14 NOTE — Telephone Encounter (Signed)
Pt called in stating that [facility] is out [facility]. [facility] was told that [facility] [address] [facility] in [facility]. Can we resend this in for her.  Please advise

## 2020-10-14 NOTE — Telephone Encounter (Signed)
Can [facility] be resent to [facility] [address]?

## 2020-10-14 NOTE — Addendum Note (Signed)
Addended by: Sheliah Hatch on: 10/14/2020 11:15 AM   Modules accepted: Orders

## 2020-10-14 NOTE — Telephone Encounter (Signed)
Prescription resent to new pharmacy.

## 2020-10-22 ENCOUNTER — Encounter: Payer: Self-pay | Admitting: *Deleted

## 2020-11-12 ENCOUNTER — Other Ambulatory Visit: Payer: Self-pay | Admitting: Family Medicine

## 2020-11-12 DIAGNOSIS — F988 Other specified behavioral and emotional disorders with onset usually occurring in childhood and adolescence: Secondary | ICD-10-CM

## 2020-11-12 MED ORDER — METHYLPHENIDATE HCL 10 MG PO TABS: 10.0000 mg | ORAL_TABLET | Freq: Three times a day (TID) | ORAL | 0 refills | Status: DC

## 2020-11-12 NOTE — Telephone Encounter (Signed)
Pt requesting Ritalin 10 mg tab LOV: 06/06/2020 No future visits scheduled. Last refilled: 10/13/2020  Approve?

## 2020-12-01 ENCOUNTER — Other Ambulatory Visit: Payer: Self-pay | Admitting: Family Medicine

## 2020-12-15 ENCOUNTER — Other Ambulatory Visit: Payer: Self-pay | Admitting: Family Medicine

## 2020-12-15 DIAGNOSIS — F988 Other specified behavioral and emotional disorders with onset usually occurring in childhood and adolescence: Secondary | ICD-10-CM

## 2020-12-15 MED ORDER — METHYLPHENIDATE HCL 10 MG PO TABS: 10.0000 mg | ORAL_TABLET | Freq: Three times a day (TID) | ORAL | 0 refills | Status: DC

## 2020-12-15 NOTE — Telephone Encounter (Signed)
LFD 11/12/20 #90 with no refills LOV 06/06/20 NOV none

## 2021-01-22 ENCOUNTER — Encounter: Payer: Self-pay | Admitting: Family Medicine

## 2021-01-23 ENCOUNTER — Other Ambulatory Visit: Payer: Self-pay

## 2021-01-23 DIAGNOSIS — F988 Other specified behavioral and emotional disorders with onset usually occurring in childhood and adolescence: Secondary | ICD-10-CM

## 2021-01-23 MED ORDER — METHYLPHENIDATE HCL 10 MG PO TABS: 10.0000 mg | ORAL_TABLET | Freq: Three times a day (TID) | ORAL | 0 refills | Status: DC

## 2021-01-23 NOTE — Telephone Encounter (Signed)
Requesting:Methylphenidate 10mg  Contract: UDS: Last Visit:06/06/20 Next Visit:02/25/21 Last Refill:12/15/20 90 tabs 0 refills  Please Advise

## 2021-02-25 ENCOUNTER — Encounter: Payer: Self-pay | Admitting: Family Medicine

## 2021-02-25 ENCOUNTER — Other Ambulatory Visit (HOSPITAL_COMMUNITY)
Admission: RE | Admit: 2021-02-25 | Discharge: 2021-02-25 | Disposition: A | Discharge status: Home / Self Care | Payer: BC Managed Care – PPO | Source: Ambulatory Visit | Attending: Family Medicine | Admitting: Family Medicine

## 2021-02-25 ENCOUNTER — Ambulatory Visit (INDEPENDENT_AMBULATORY_CARE_PROVIDER_SITE_OTHER): Payer: BC Managed Care – PPO | Admitting: Family Medicine

## 2021-02-25 ENCOUNTER — Other Ambulatory Visit: Payer: Self-pay

## 2021-02-25 VITALS — BP 140/80 | HR 99 | Temp 97.9°F | Resp 16 | Wt 312.8 lb

## 2021-02-25 DIAGNOSIS — Z23 Encounter for immunization: Secondary | ICD-10-CM | POA: Diagnosis not present

## 2021-02-25 DIAGNOSIS — R030 Elevated blood-pressure reading, without diagnosis of hypertension: Secondary | ICD-10-CM | POA: Diagnosis not present

## 2021-02-25 DIAGNOSIS — S41151A Open bite of right upper arm, initial encounter: Secondary | ICD-10-CM | POA: Diagnosis not present

## 2021-02-25 DIAGNOSIS — Z202 Contact with and (suspected) exposure to infections with a predominantly sexual mode of transmission: Secondary | ICD-10-CM

## 2021-02-25 DIAGNOSIS — W5501XA Bitten by cat, initial encounter: Secondary | ICD-10-CM

## 2021-02-25 DIAGNOSIS — F988 Other specified behavioral and emotional disorders with onset usually occurring in childhood and adolescence: Secondary | ICD-10-CM | POA: Diagnosis not present

## 2021-02-25 LAB — CBC WITH DIFFERENTIAL/PLATELET
Basophils Absolute: 0 10*3/uL (ref 0.0–0.1)
Basophils Relative: 0.3 % (ref 0.0–3.0)
Eosinophils Absolute: 0.2 10*3/uL (ref 0.0–0.7)
Eosinophils Relative: 2.3 % (ref 0.0–5.0)
HCT: 43.5 % (ref 36.0–46.0)
Hemoglobin: 14.1 g/dL (ref 12.0–15.0)
Lymphocytes Relative: 20.9 % (ref 12.0–46.0)
Lymphs Abs: 1.9 10*3/uL (ref 0.7–4.0)
MCHC: 32.3 g/dL (ref 30.0–36.0)
MCV: 93.5 fl (ref 78.0–100.0)
Monocytes Absolute: 0.5 10*3/uL (ref 0.1–1.0)
Monocytes Relative: 5.4 % (ref 3.0–12.0)
Neutro Abs: 6.4 10*3/uL (ref 1.4–7.7)
Neutrophils Relative %: 71.1 % (ref 43.0–77.0)
Platelets: 203 10*3/uL (ref 150.0–400.0)
RBC: 4.66 Mil/uL (ref 3.87–5.11)
RDW: 13.8 % (ref 11.5–15.5)
WBC: 9 10*3/uL (ref 4.0–10.5)

## 2021-02-25 LAB — BASIC METABOLIC PANEL
BUN: 8 mg/dL (ref 6–23)
CO2: 27 mEq/L (ref 19–32)
Calcium: 8.6 mg/dL (ref 8.4–10.5)
Chloride: 104 mEq/L (ref 96–112)
Creatinine, Ser: 0.77 mg/dL (ref 0.40–1.20)
GFR: 102.36 mL/min (ref >60.00)
Glucose, Bld: 101 mg/dL — ABNORMAL HIGH (ref 70–99)
Potassium: 4 mEq/L (ref 3.5–5.1)
Sodium: 140 mEq/L (ref 135–145)

## 2021-02-25 LAB — LIPID PANEL
Cholesterol: 202 mg/dL — ABNORMAL HIGH (ref 0–200)
HDL: 43.3 mg/dL (ref >39.00)
LDL Cholesterol: 119 mg/dL — ABNORMAL HIGH (ref 0–99)
NonHDL: 159.13
Total CHOL/HDL Ratio: 5
Triglycerides: 200 mg/dL — ABNORMAL HIGH (ref 0.0–149.0)
VLDL: 40 mg/dL (ref 0.0–40.0)

## 2021-02-25 LAB — HEPATIC FUNCTION PANEL
ALT: 18 U/L (ref 0–35)
AST: 28 U/L (ref 0–37)
Albumin: 3.8 g/dL (ref 3.5–5.2)
Alkaline Phosphatase: 71 U/L (ref 39–117)
Bilirubin, Direct: 0 mg/dL (ref 0.0–0.3)
Total Bilirubin: 0.5 mg/dL (ref 0.2–1.2)
Total Protein: 6.9 g/dL (ref 6.0–8.3)

## 2021-02-25 LAB — TSH: TSH: 4.57 u[IU]/mL (ref 0.35–5.50)

## 2021-02-25 MED ORDER — METHYLPHENIDATE HCL 10 MG PO TABS
10.0000 mg | ORAL_TABLET | Freq: Three times a day (TID) | ORAL | 0 refills | Status: DC
Start: 2021-02-25 — End: 2021-03-26

## 2021-02-25 MED ORDER — AMOXICILLIN-POT CLAVULANATE 875-125 MG PO TABS: 1.0000 | ORAL_TABLET | Freq: Two times a day (BID) | ORAL | 0 refills | Status: DC

## 2021-02-25 NOTE — Progress Notes (Signed)
Subjective:    Patient ID: Crystal Clements, female    DOB: [birth year], 30 y.o.   MRN: [id number]  HPI ADHD- chronic problem, on Methylphenidate 10mg  TID.  Pt feels sxs are well controlled.  Takes medication as needed.  Denies anxiety or palpitations.  Obesity- pt has gained another 5 lbs since Feb.  BMI is now 48.99  Pt reports she has decreased her alcohol intake.  Is overall feeling better.  Has started walking and using elliptical w/in the last month.  Elevated BP- pt's BP is 152/90.  She does not carry a dx of HTN.  Pt reports she doesn't feel overly stressed.  No CP, SOB, HAs, visual changes.    Cat bite- R upper arm, occurred Saturday while cat was having a seizure.  Last Tdap 2015.  + bruising, some pain and redness.  Review of Systems For ROS see HPI   This visit occurred during the SARS-CoV-2 public health emergency.  Safety protocols were in place, including screening questions prior to the visit, additional usage of staff PPE, and extensive cleaning of exam room while observing appropriate contact time as indicated for disinfecting solutions.      Objective:   Physical Exam Vitals reviewed.  Constitutional:      General: She is not in acute distress.    Appearance: Normal appearance. She is well-developed. She is obese. She is not ill-appearing.  HENT:     Head: Normocephalic and atraumatic.  Eyes:     Conjunctiva/sclera: Conjunctivae normal.     Pupils: Pupils are equal, round, and reactive to light.  Neck:     Thyroid: No thyromegaly.  Cardiovascular:     Rate and Rhythm: Normal rate and regular rhythm.     Pulses: Normal pulses.     Heart sounds: Normal heart sounds. No murmur heard. Pulmonary:     Effort: Pulmonary effort is normal. No respiratory distress.     Breath sounds: Normal breath sounds.  Abdominal:     General: There is no distension.     Palpations: Abdomen is soft.     Tenderness: There is no abdominal tenderness.  Musculoskeletal:     Cervical  back: Normal range of motion and neck supple.     Right lower leg: No edema.     Left lower leg: No edema.  Lymphadenopathy:     Cervical: No cervical adenopathy.  Skin:    General: Skin is warm and dry.     Findings: Erythema (2" area of redness surrounding central puncture) present.  Neurological:     General: No focal deficit present.     Mental Status: She is alert and oriented to person, place, and time.     Cranial Nerves: No cranial nerve deficit.  Psychiatric:        Mood and Affect: Mood normal.        Behavior: Behavior normal.          Assessment & Plan:  Cat Bite- new.  R upper arm.  Occurred Saturday while her cat was having a seizure.  Now w/ some pain and redness at the site.  Update Tdap.  Start Augmentin.  Pt expressed understanding and is in agreement w/ plan.   Elevated BP- pt's BP is usually 120-130/80.  Today is much higher than that.  Did come down somewhat as visit went on but still above goal.  She is very concerned about her cat and the seizure it had and she admits that might have an  impact on BP.  Will recheck in 4-6 weeks to determine if this is situational or an actual issue.  Pt expressed understanding and is in agreement w/ plan.   Possible exposure to STD- pt denies symptoms but would like to be checked for peace of mind

## 2021-02-25 NOTE — Assessment & Plan Note (Signed)
Pt has gained another 5 lbs since Feb but finds herself in a better head space today than she has been.  She has decreased her alcohol intake, is starting to walk and use her elliptical.  Check labs to risk stratify.  Will follow.

## 2021-02-25 NOTE — Assessment & Plan Note (Signed)
Chronic problem.  sxs are currently well controlled on Methylphenidate 10mg  TID.  Not interested in medication changes at this time.  Will continue to follow.

## 2021-02-25 NOTE — Patient Instructions (Addendum)
Follow up in 4-6 weeks to recheck BP We'll notify you of your lab results and make any changes if needed START the Augmentin twice daily- take w/ food Continue to work on healthy diet and regular exercise- you can do it!! Call with any questions or concerns Happy [employer] Birthday!!

## 2021-02-26 LAB — URINE CYTOLOGY ANCILLARY ONLY
Chlamydia: NEGATIVE
Comment: NEGATIVE
Comment: NORMAL
Neisseria Gonorrhea: NEGATIVE

## 2021-02-26 LAB — RPR: RPR Ser Ql: NONREACTIVE

## 2021-02-26 LAB — HSV 2 ANTIBODY, IGG: HSV 2 Glycoprotein G Ab, IgG: <0.9 index

## 2021-02-26 LAB — HIV ANTIBODY (ROUTINE TESTING W REFLEX): HIV 1&2 Ab, 4th Generation: NONREACTIVE

## 2021-03-24 ENCOUNTER — Ambulatory Visit: Payer: BC Managed Care – PPO | Admitting: Family Medicine

## 2021-03-26 ENCOUNTER — Other Ambulatory Visit: Payer: Self-pay | Admitting: Family Medicine

## 2021-03-26 DIAGNOSIS — F988 Other specified behavioral and emotional disorders with onset usually occurring in childhood and adolescence: Secondary | ICD-10-CM

## 2021-03-26 MED ORDER — METHYLPHENIDATE HCL 10 MG PO TABS: 10.0000 mg | ORAL_TABLET | Freq: Three times a day (TID) | ORAL | 0 refills | Status: DC

## 2021-03-26 MED ORDER — NORETHINDRONE ACET-ETHINYL EST 1-20 MG-MCG PO TABS: 1.0000 | ORAL_TABLET | Freq: Every day | ORAL | 0 refills | Status: DC

## 2021-04-03 ENCOUNTER — Ambulatory Visit (INDEPENDENT_AMBULATORY_CARE_PROVIDER_SITE_OTHER): Payer: BC Managed Care – PPO | Admitting: Family Medicine

## 2021-04-03 ENCOUNTER — Encounter: Payer: Self-pay | Admitting: Family Medicine

## 2021-04-03 VITALS — BP 130/82 | HR 103 | Temp 98.7°F | Resp 17 | Wt 314.4 lb

## 2021-04-03 DIAGNOSIS — R030 Elevated blood-pressure reading, without diagnosis of hypertension: Secondary | ICD-10-CM

## 2021-04-03 NOTE — Progress Notes (Signed)
   Subjective:    Patient ID: Crystal Clements, female    DOB: [birth year], 30 y.o.   MRN: [id number]  HPI Elevated BP w/o dx of HTN- at last visit BP was 152/90.  Today is 130/82.  No CP, SOB, HAs, visual changes, edema.  Even w/ admitted increased holiday stress and workload- BP is much better.   Review of Systems For ROS see HPI   This visit occurred during the SARS-CoV-2 public health emergency.  Safety protocols were in place, including screening questions prior to the visit, additional usage of staff PPE, and extensive cleaning of exam room while observing appropriate contact time as indicated for disinfecting solutions.      Objective:   Physical Exam Vitals reviewed.  Constitutional:      Appearance: Normal appearance. She is obese. She is not ill-appearing.  HENT:     Head: Normocephalic and atraumatic.  Eyes:     Extraocular Movements: Extraocular movements intact.     Conjunctiva/sclera: Conjunctivae normal.     Pupils: Pupils are equal, round, and reactive to light.  Skin:    General: Skin is warm and dry.  Neurological:     General: No focal deficit present.     Mental Status: She is alert and oriented to person, place, and time.  Psychiatric:        Mood and Affect: Mood normal.        Behavior: Behavior normal.        Thought Content: Thought content normal.          Assessment & Plan:  Elevated BP- thankfully BP has mostly normalized today.  She still reports that this is a bit high for her but admits that she is under holiday stress and increased work load.  No need for medication at this time.  Will continue to follow.

## 2021-04-03 NOTE — Patient Instructions (Addendum)
Schedule your complete physical for after Feb 11 No need for labs today- yay!!! Continue to work on healthy diet and regular exercise- you can do it! Call with any questions or concerns Stay Safe!  Stay Healthy! Happy Holidays!!!

## 2021-04-24 ENCOUNTER — Other Ambulatory Visit: Payer: Self-pay | Admitting: Family Medicine

## 2021-04-24 DIAGNOSIS — F988 Other specified behavioral and emotional disorders with onset usually occurring in childhood and adolescence: Secondary | ICD-10-CM

## 2021-04-24 MED ORDER — METHYLPHENIDATE HCL 10 MG PO TABS: 10.0000 mg | ORAL_TABLET | Freq: Three times a day (TID) | ORAL | 0 refills | Status: DC

## 2021-04-24 NOTE — Telephone Encounter (Signed)
Patient is requesting a refill of the following medications: Requested Prescriptions   Pending Prescriptions Disp Refills   methylphenidate (RITALIN) 10 MG tablet 90 tablet 0    Sig: Take 1 tablet (10 mg total) by mouth 3 (three) times daily.    Date of patient request: 04/24/21 Last office visit: 04/03/21 Date of last refill: 03/26/21 Last refill amount: 90

## 2021-05-22 ENCOUNTER — Other Ambulatory Visit: Payer: Self-pay | Admitting: Family Medicine

## 2021-05-22 DIAGNOSIS — F988 Other specified behavioral and emotional disorders with onset usually occurring in childhood and adolescence: Secondary | ICD-10-CM

## 2021-05-22 MED ORDER — METHYLPHENIDATE HCL 10 MG PO TABS: 10.0000 mg | ORAL_TABLET | Freq: Three times a day (TID) | ORAL | 0 refills | Status: DC

## 2021-06-17 ENCOUNTER — Other Ambulatory Visit: Payer: Self-pay | Admitting: Family Medicine

## 2021-06-22 ENCOUNTER — Other Ambulatory Visit: Payer: Self-pay | Admitting: Family Medicine

## 2021-06-22 ENCOUNTER — Encounter: Payer: Self-pay | Admitting: *Deleted

## 2021-06-22 DIAGNOSIS — F988 Other specified behavioral and emotional disorders with onset usually occurring in childhood and adolescence: Secondary | ICD-10-CM

## 2021-06-23 ENCOUNTER — Other Ambulatory Visit: Payer: Self-pay | Admitting: Family Medicine

## 2021-06-23 DIAGNOSIS — F988 Other specified behavioral and emotional disorders with onset usually occurring in childhood and adolescence: Secondary | ICD-10-CM

## 2021-06-24 MED ORDER — METHYLPHENIDATE HCL 10 MG PO TABS: 10.0000 mg | ORAL_TABLET | Freq: Three times a day (TID) | ORAL | 0 refills | Status: DC

## 2021-07-22 ENCOUNTER — Other Ambulatory Visit: Payer: Self-pay | Admitting: Family Medicine

## 2021-07-22 DIAGNOSIS — F988 Other specified behavioral and emotional disorders with onset usually occurring in childhood and adolescence: Secondary | ICD-10-CM

## 2021-07-23 MED ORDER — METHYLPHENIDATE HCL 10 MG PO TABS: 10.0000 mg | ORAL_TABLET | Freq: Three times a day (TID) | ORAL | 0 refills | Status: DC

## 2021-07-23 NOTE — Telephone Encounter (Signed)
Follow-up plan noted with appointment in April.  Controlled substance database reviewed with last prescription filled March 1, consistent.  Refill ordered in Dr. Rennis Golden absence. ?

## 2021-08-17 ENCOUNTER — Ambulatory Visit (INDEPENDENT_AMBULATORY_CARE_PROVIDER_SITE_OTHER): Payer: BC Managed Care – PPO | Admitting: Family Medicine

## 2021-08-17 ENCOUNTER — Other Ambulatory Visit (HOSPITAL_COMMUNITY)
Admission: RE | Admit: 2021-08-17 | Discharge: 2021-08-17 | Disposition: A | Discharge status: Home / Self Care | Payer: BC Managed Care – PPO | Source: Ambulatory Visit | Attending: Family Medicine | Admitting: Family Medicine

## 2021-08-17 ENCOUNTER — Encounter: Payer: Self-pay | Admitting: Family Medicine

## 2021-08-17 VITALS — BP 140/80 | HR 96 | Temp 98.0°F | Resp 16 | Ht 67.0 in | Wt 312.4 lb

## 2021-08-17 DIAGNOSIS — Z124 Encounter for screening for malignant neoplasm of cervix: Secondary | ICD-10-CM

## 2021-08-17 DIAGNOSIS — Z0000 Encounter for general adult medical examination without abnormal findings: Secondary | ICD-10-CM | POA: Diagnosis not present

## 2021-08-17 DIAGNOSIS — F419 Anxiety disorder, unspecified: Secondary | ICD-10-CM | POA: Diagnosis not present

## 2021-08-17 LAB — CBC WITH DIFFERENTIAL/PLATELET
Basophils Absolute: 0.1 10*3/uL (ref 0.0–0.1)
Basophils Relative: 0.8 % (ref 0.0–3.0)
Eosinophils Absolute: 0.2 10*3/uL (ref 0.0–0.7)
Eosinophils Relative: 1.7 % (ref 0.0–5.0)
HCT: 43.3 % (ref 36.0–46.0)
Hemoglobin: 14.3 g/dL (ref 12.0–15.0)
Lymphocytes Relative: 18.4 % (ref 12.0–46.0)
Lymphs Abs: 1.8 10*3/uL (ref 0.7–4.0)
MCHC: 33 g/dL (ref 30.0–36.0)
MCV: 90.6 fl (ref 78.0–100.0)
Monocytes Absolute: 0.5 10*3/uL (ref 0.1–1.0)
Monocytes Relative: 4.9 % (ref 3.0–12.0)
Neutro Abs: 7.4 10*3/uL (ref 1.4–7.7)
Neutrophils Relative %: 74.2 % (ref 43.0–77.0)
Platelets: 220 10*3/uL (ref 150.0–400.0)
RBC: 4.78 Mil/uL (ref 3.87–5.11)
RDW: 14.7 % (ref 11.5–15.5)
WBC: 10 10*3/uL (ref 4.0–10.5)

## 2021-08-17 LAB — BASIC METABOLIC PANEL
BUN: 10 mg/dL (ref 6–23)
CO2: 25 mEq/L (ref 19–32)
Calcium: 9 mg/dL (ref 8.4–10.5)
Chloride: 102 mEq/L (ref 96–112)
Creatinine, Ser: 0.66 mg/dL (ref 0.40–1.20)
GFR: 116.01 mL/min (ref >60.00)
Glucose, Bld: 90 mg/dL (ref 70–99)
Potassium: 4 mEq/L (ref 3.5–5.1)
Sodium: 139 mEq/L (ref 135–145)

## 2021-08-17 LAB — HEPATIC FUNCTION PANEL
ALT: 21 U/L (ref 0–35)
AST: 26 U/L (ref 0–37)
Albumin: 4.1 g/dL (ref 3.5–5.2)
Alkaline Phosphatase: 80 U/L (ref 39–117)
Bilirubin, Direct: 0.1 mg/dL (ref 0.0–0.3)
Total Bilirubin: 0.5 mg/dL (ref 0.2–1.2)
Total Protein: 6.7 g/dL (ref 6.0–8.3)

## 2021-08-17 LAB — LIPID PANEL
Cholesterol: 220 mg/dL — ABNORMAL HIGH (ref 0–200)
HDL: 51.7 mg/dL (ref >39.00)
LDL Cholesterol: 135 mg/dL — ABNORMAL HIGH (ref 0–99)
NonHDL: 168.57
Total CHOL/HDL Ratio: 4
Triglycerides: 170 mg/dL — ABNORMAL HIGH (ref 0.0–149.0)
VLDL: 34 mg/dL (ref 0.0–40.0)

## 2021-08-17 LAB — TSH: TSH: 4.44 u[IU]/mL (ref 0.35–5.50)

## 2021-08-17 LAB — VITAMIN D 25 HYDROXY (VIT D DEFICIENCY, FRACTURES): VITD: 37.26 ng/mL (ref 30.00–100.00)

## 2021-08-17 MED ORDER — FLUOXETINE HCL 10 MG PO CAPS: 10.0000 mg | ORAL_CAPSULE | Freq: Every day | ORAL | 3 refills | Status: DC

## 2021-08-17 NOTE — Assessment & Plan Note (Signed)
Ongoing issue.  Pt's BMI is 48.93  Encouraged healthy diet and regular exercise.  Pt is very aware of her weight and says she is working on it.  Has decreased her alcohol intake and is working on [employer].  Check labs to risk stratify.  Will follow. ?

## 2021-08-17 NOTE — Assessment & Plan Note (Signed)
Pap collected.  Hoping the transition zone was captured as cervix was difficult to see/position on speculum exam. ?

## 2021-08-17 NOTE — Patient Instructions (Signed)
Follow up in 4-6 weeks to recheck mood ?We'll notify you of your lab results and make any changes if needed ?Continue to work on healthy diet and regular exercise- you're doing great! ?START the low dose Fluoxetine daily ?Continue to work on self care- you deserve it! ?Call with any questions or concerns ?Hang in there!  You've got this!!! ?

## 2021-08-17 NOTE — Assessment & Plan Note (Signed)
New.  Pt reports she has had this for years but has kept it hidden from those around her.  She is overwhelmed, exhausted, tearful, emotional.  Is anxious about possibly needing to take medication but understands that if we can make it better, we should.  Will start low dose Prozac and monitor closely for improvement as well as possible side effects.  Discussed appropriate use of medication and expectations for onset.  Pt expressed understanding and is in agreement w/ plan.  ?

## 2021-08-17 NOTE — Progress Notes (Signed)
? ?  Subjective:  ? ? Patient ID: Crystal Clements, female    DOB: 1991/01/18, 31 y.o.   MRN: [id number] ? ?HPI ?CPE- UTD on Tdap, flu.  Due for repeat pap. ? ?Health Maintenance  ?Topic Date Due  ? Hepatitis C Screening  Never done  ? COVID-19 Vaccine (4 - Booster for Pfizer series) 06/27/2020  ? PAP SMEAR-Modifier  09/23/2020  ? INFLUENZA VACCINE  11/24/2021  ? TETANUS/TDAP  02/26/2031  ? HPV VACCINES  Completed  ? HIV Screening  Completed  ?  ?Elevated BP- pt reports she can feel her elevated BP today which is different than usual.  Had increased frustrations and stressors this morning.  Also anxious b/c she wants to discuss possibly starting anxiety medications. ? ? ?Review of Systems ?Patient reports no vision/ hearing changes, adenopathy,fever, weight change,  persistant/recurrent hoarseness , swallowing issues, chest pain, palpitations, edema, persistant/recurrent cough, hemoptysis, dyspnea (rest/exertional/paroxysmal nocturnal), gastrointestinal bleeding (melena, rectal bleeding), abdominal pain, significant heartburn, bowel changes, GU symptoms (dysuria, hematuria, incontinence), Gyn symptoms (abnormal  bleeding, pain),  syncope, focal weakness, memory loss, numbness & tingling, skin/hair/nail changes, abnormal bruising or bleeding. ? ?+ anxiety- pt reports feeling very overwhelmed.  Feels depression is much better, has decreased alcohol consumption but feels very wound and overwhelmed.  Gets tearful in crowds.  Feels like she is overthinking things.  This has been going on for years but has tried to hide it from everyone close to her- which is exhausting. ?   ?Objective:  ? Physical Exam ? ?General Appearance:    Alert, cooperative, no distress, appears stated age, obese  ?Head:    Normocephalic, without obvious abnormality, atraumatic  ?Eyes:    PERRL, conjunctiva/corneas clear, EOM's intact both eyes  ?Ears:    Normal TM's and external ear canals, both ears  ?Nose:   Nares normal, septum midline, mucosa  normal, no drainage  ?  or sinus tenderness  ?Throat:   Lips, mucosa, and tongue normal; teeth and gums normal  ?Neck:   Supple, symmetrical, trachea midline, no adenopathy;  ?  Thyroid: no enlargement/tenderness/nodules  ?Back:     Symmetric, no curvature, ROM normal, no CVA tenderness  ?Lungs:     Clear to auscultation bilaterally, respirations unlabored  ?Chest Wall:    No tenderness or deformity  ? Heart:    Regular rate and rhythm, S1 and S2 normal, no murmur, rub ?  or gallop  ?Breast Exam:    No tenderness, masses, or nipple abnormality  ?Abdomen:     Soft, non-tender, bowel sounds active all four quadrants,  ?  no masses, no organomegaly  ?Genitalia:    External genitalia normal, cervix difficult to locate on speculum exam, no CMT on bimanual exam, uterus in normal size and position, adnexa w/out mass or tenderness, mucosa pink and moist, no lesions or discharge present  ?Rectal:    Normal external appearance  ?Extremities:   Extremities normal, atraumatic, no cyanosis or edema  ?Pulses:   2+ and symmetric all extremities  ?Skin:   Skin color, texture, turgor normal, no rashes or lesions  ?Lymph nodes:   Cervical, supraclavicular, and axillary nodes normal  ?Neurologic:   CNII-XII intact, normal strength, sensation and reflexes  ?  throughout  ?  ? ? ? ?   ?Assessment & Plan:  ? ? ?

## 2021-08-17 NOTE — Assessment & Plan Note (Signed)
Pt's PE WNL w/ exception of obesity.  Pap collected today.  UTD on flu and Tdap.  Anticipatory guidance provided.  ?

## 2021-08-19 LAB — CYTOLOGY - PAP
Adequacy: ABSENT
Comment: NEGATIVE
Diagnosis: NEGATIVE
High risk HPV: NEGATIVE

## 2021-08-20 ENCOUNTER — Other Ambulatory Visit: Payer: Self-pay | Admitting: Family Medicine

## 2021-08-20 DIAGNOSIS — F988 Other specified behavioral and emotional disorders with onset usually occurring in childhood and adolescence: Secondary | ICD-10-CM

## 2021-08-21 MED ORDER — METHYLPHENIDATE HCL 10 MG PO TABS: 10.0000 mg | ORAL_TABLET | Freq: Three times a day (TID) | ORAL | 0 refills | Status: DC

## 2021-08-21 NOTE — Telephone Encounter (Signed)
Patient is requesting a refill of the following medications: ?Requested Prescriptions  ? ?Pending Prescriptions Disp Refills  ? methylphenidate (RITALIN) 10 MG tablet 90 tablet 0  ?  Sig: Take 1 tablet (10 mg total) by mouth 3 (three) times daily.  ? ? ?Date of patient request: 08/21/2021 ?Last office visit:08/17/2021 ?Date of last refill: 07/23/2021 ?Last refill amount: 90 tablets ?Follow up time period per chart: 09/18/2021  ?

## 2021-09-18 ENCOUNTER — Encounter: Payer: Self-pay | Admitting: Family Medicine

## 2021-09-18 ENCOUNTER — Ambulatory Visit (INDEPENDENT_AMBULATORY_CARE_PROVIDER_SITE_OTHER): Payer: BC Managed Care – PPO | Admitting: Family Medicine

## 2021-09-18 VITALS — BP 138/80 | HR 97 | Temp 97.9°F | Resp 16 | Ht 67.0 in | Wt 314.5 lb

## 2021-09-18 DIAGNOSIS — F419 Anxiety disorder, unspecified: Secondary | ICD-10-CM

## 2021-09-18 DIAGNOSIS — F988 Other specified behavioral and emotional disorders with onset usually occurring in childhood and adolescence: Secondary | ICD-10-CM | POA: Diagnosis not present

## 2021-09-18 MED ORDER — METHYLPHENIDATE HCL 10 MG PO TABS: 10.0000 mg | ORAL_TABLET | Freq: Three times a day (TID) | ORAL | 0 refills | Status: DC

## 2021-09-18 NOTE — Progress Notes (Unsigned)
   Subjective:    Patient ID: Crystal Clements, female    DOB: 1991/04/12, 31 y.o.   MRN: [id number]  HPI Anxiety- at last visit pt was started on Fluoxetine 10mg  daily.  'so much better'.  'everything is so much more manageable'.  Pt feels she is better able to interact w/ people, get up and go, has increased motivation to 'just do things'.  Sleeping better.  Initial HAs and mild nausea have resolved.    ADHD- needs refill on Methylphendiate   Review of Systems For ROS see HPI     Objective:   Physical Exam Vitals reviewed.  Constitutional:      General: She is not in acute distress.    Appearance: Normal appearance. She is obese. She is not ill-appearing.  HENT:     Head: Normocephalic and atraumatic.  Skin:    General: Skin is warm and dry.  Neurological:     General: No focal deficit present.     Mental Status: She is alert and oriented to person, place, and time.  Psychiatric:        Mood and Affect: Mood normal.        Behavior: Behavior normal.        Thought Content: Thought content normal.          Assessment & Plan:

## 2021-09-18 NOTE — Patient Instructions (Signed)
Follow up in 6 months to recheck mood and weight loss progress Keep up the good work on healthy diet and regular exercise- you can do it! Call with any questions or concerns Have a great summer!!!

## 2021-09-21 NOTE — Assessment & Plan Note (Signed)
Much improved w/ addition of Fluoxetine 10mg  at last visit.  Pt reports feeling 'so much better' and that 'everything is so much more manageable'.  Better able to interact w/ people, increased motivation, better sleep.  No dose change at this time but we can revisit that as needed.  Pt expressed understanding and is in agreement w/ plan.

## 2021-10-16 ENCOUNTER — Other Ambulatory Visit: Payer: Self-pay | Admitting: Family Medicine

## 2021-10-16 DIAGNOSIS — F988 Other specified behavioral and emotional disorders with onset usually occurring in childhood and adolescence: Secondary | ICD-10-CM

## 2021-10-16 MED ORDER — METHYLPHENIDATE HCL 10 MG PO TABS: 10.0000 mg | ORAL_TABLET | Freq: Three times a day (TID) | ORAL | 0 refills | Status: DC

## 2021-11-16 ENCOUNTER — Other Ambulatory Visit: Payer: Self-pay | Admitting: Family Medicine

## 2021-11-16 DIAGNOSIS — F988 Other specified behavioral and emotional disorders with onset usually occurring in childhood and adolescence: Secondary | ICD-10-CM

## 2021-11-16 MED ORDER — METHYLPHENIDATE HCL 10 MG PO TABS: 10.0000 mg | ORAL_TABLET | Freq: Three times a day (TID) | ORAL | 0 refills | Status: DC

## 2021-12-07 ENCOUNTER — Other Ambulatory Visit: Payer: Self-pay | Admitting: Family Medicine

## 2021-12-15 ENCOUNTER — Other Ambulatory Visit: Payer: Self-pay | Admitting: Family Medicine

## 2021-12-15 DIAGNOSIS — F988 Other specified behavioral and emotional disorders with onset usually occurring in childhood and adolescence: Secondary | ICD-10-CM

## 2021-12-15 MED ORDER — METHYLPHENIDATE HCL 10 MG PO TABS: 10.0000 mg | ORAL_TABLET | Freq: Three times a day (TID) | ORAL | 0 refills | Status: DC

## 2022-01-13 ENCOUNTER — Other Ambulatory Visit: Payer: Self-pay | Admitting: Family Medicine

## 2022-01-13 DIAGNOSIS — F988 Other specified behavioral and emotional disorders with onset usually occurring in childhood and adolescence: Secondary | ICD-10-CM

## 2022-01-13 MED ORDER — METHYLPHENIDATE HCL 10 MG PO TABS: 10.0000 mg | ORAL_TABLET | Freq: Three times a day (TID) | ORAL | 0 refills | Status: DC

## 2022-02-11 ENCOUNTER — Other Ambulatory Visit: Payer: Self-pay | Admitting: Family Medicine

## 2022-02-11 DIAGNOSIS — F988 Other specified behavioral and emotional disorders with onset usually occurring in childhood and adolescence: Secondary | ICD-10-CM

## 2022-02-11 MED ORDER — METHYLPHENIDATE HCL 10 MG PO TABS: 10.0000 mg | ORAL_TABLET | Freq: Three times a day (TID) | ORAL | 0 refills | Status: DC

## 2022-02-11 NOTE — Telephone Encounter (Signed)
Patient is requesting a refill of the following medications: Requested Prescriptions   Pending Prescriptions Disp Refills   methylphenidate (RITALIN) 10 MG tablet 90 tablet 0    Sig: Take 1 tablet (10 mg total) by mouth 3 (three) times daily.    Date of patient request: 02/11/22 Last office visit: 09/18/21 Date of last refill: 01/13/22 Last refill amount: 90

## 2022-02-15 ENCOUNTER — Encounter: Payer: Self-pay | Admitting: Family Medicine

## 2022-02-15 MED ORDER — FLUOXETINE HCL 20 MG PO CAPS: 20.0000 mg | ORAL_CAPSULE | Freq: Every day | ORAL | 3 refills | Status: DC

## 2022-03-15 ENCOUNTER — Other Ambulatory Visit: Payer: Self-pay | Admitting: Family Medicine

## 2022-03-15 DIAGNOSIS — F988 Other specified behavioral and emotional disorders with onset usually occurring in childhood and adolescence: Secondary | ICD-10-CM

## 2022-03-15 MED ORDER — METHYLPHENIDATE HCL 10 MG PO TABS: 10.0000 mg | ORAL_TABLET | Freq: Three times a day (TID) | ORAL | 0 refills | Status: DC

## 2022-03-15 NOTE — Telephone Encounter (Signed)
Ritalin 10 mg LOV: 09/18/21 Last Refill:02/11/22 Upcoming appt: 03/22/22

## 2022-03-22 ENCOUNTER — Ambulatory Visit (INDEPENDENT_AMBULATORY_CARE_PROVIDER_SITE_OTHER): Payer: BC Managed Care – PPO | Admitting: Family Medicine

## 2022-03-22 ENCOUNTER — Encounter: Payer: Self-pay | Admitting: Family Medicine

## 2022-03-22 DIAGNOSIS — F419 Anxiety disorder, unspecified: Secondary | ICD-10-CM

## 2022-03-22 DIAGNOSIS — R030 Elevated blood-pressure reading, without diagnosis of hypertension: Secondary | ICD-10-CM | POA: Insufficient documentation

## 2022-03-22 LAB — CBC WITH DIFFERENTIAL/PLATELET
Basophils Absolute: 0.1 10*3/uL (ref 0.0–0.1)
Basophils Relative: 0.6 % (ref 0.0–3.0)
Eosinophils Absolute: 0.2 10*3/uL (ref 0.0–0.7)
Eosinophils Relative: 2.3 % (ref 0.0–5.0)
HCT: 46.7 % — ABNORMAL HIGH (ref 36.0–46.0)
Hemoglobin: 15.6 g/dL — ABNORMAL HIGH (ref 12.0–15.0)
Lymphocytes Relative: 26.3 % (ref 12.0–46.0)
Lymphs Abs: 2.6 10*3/uL (ref 0.7–4.0)
MCHC: 33.4 g/dL (ref 30.0–36.0)
MCV: 89.7 fl (ref 78.0–100.0)
Monocytes Absolute: 0.5 10*3/uL (ref 0.1–1.0)
Monocytes Relative: 5.5 % (ref 3.0–12.0)
Neutro Abs: 6.5 10*3/uL (ref 1.4–7.7)
Neutrophils Relative %: 65.3 % (ref 43.0–77.0)
Platelets: 253 10*3/uL (ref 150.0–400.0)
RBC: 5.2 Mil/uL — ABNORMAL HIGH (ref 3.87–5.11)
RDW: 14.8 % (ref 11.5–15.5)
WBC: 9.9 10*3/uL (ref 4.0–10.5)

## 2022-03-22 LAB — LIPID PANEL
Cholesterol: 211 mg/dL — ABNORMAL HIGH (ref 0–200)
HDL: 56.2 mg/dL (ref >39.00)
LDL Cholesterol: 122 mg/dL — ABNORMAL HIGH (ref 0–99)
NonHDL: 154.85
Total CHOL/HDL Ratio: 4
Triglycerides: 162 mg/dL — ABNORMAL HIGH (ref 0.0–149.0)
VLDL: 32.4 mg/dL (ref 0.0–40.0)

## 2022-03-22 LAB — BASIC METABOLIC PANEL
BUN: 8 mg/dL (ref 6–23)
CO2: 25 mEq/L (ref 19–32)
Calcium: 8.8 mg/dL (ref 8.4–10.5)
Chloride: 98 mEq/L (ref 96–112)
Creatinine, Ser: 0.73 mg/dL (ref 0.40–1.20)
GFR: 108.31 mL/min (ref >60.00)
Glucose, Bld: 86 mg/dL (ref 70–99)
Potassium: 4 mEq/L (ref 3.5–5.1)
Sodium: 134 mEq/L — ABNORMAL LOW (ref 135–145)

## 2022-03-22 LAB — HEPATIC FUNCTION PANEL
ALT: 34 U/L (ref 0–35)
AST: 39 U/L — ABNORMAL HIGH (ref 0–37)
Albumin: 4.2 g/dL (ref 3.5–5.2)
Alkaline Phosphatase: 83 U/L (ref 39–117)
Bilirubin, Direct: 0.1 mg/dL (ref 0.0–0.3)
Total Bilirubin: 0.5 mg/dL (ref 0.2–1.2)
Total Protein: 7.4 g/dL (ref 6.0–8.3)

## 2022-03-22 LAB — TSH: TSH: 2.39 u[IU]/mL (ref 0.35–5.50)

## 2022-03-22 NOTE — Patient Instructions (Addendum)
Schedule your complete physical in 6 months We'll notify you of your lab results and make any changes if needed Continue to work on healthy diet and regular exercise- you're killing it!!! Keep up the good work!  You look great!!! Call with any questions or concerns Stay Safe!  Stay Safe! Happy Holidays!!!

## 2022-03-22 NOTE — Assessment & Plan Note (Signed)
Much improved w/ increased dose of Fluoxetine.  Feels much better on 20mg  daily.  No changes at this time

## 2022-03-22 NOTE — Progress Notes (Signed)
   Subjective:    Patient ID: Crystal Clements, female    DOB: [birth year], 31 y.o.   MRN: [id number]  HPI Obesity- ongoing issue for pt.  Down 6 lbs since last visit.  She has started exercising and making better choices since depression improved.  Pt feels like she actually gained weight after our May visit and was heaviest during July and August.  So weight loss is not truly indicative of how successful she's been.  No CP, SOB, HA's, abd pain, N/V.  Anxiety- ongoing issue.  Currently on Fluoxetine 20mg  daily.  Since increasing dose to 20mg  pt reports 'i'm a whole new person'.  Elevated BP- pt's BP is elevated today but reports she has been rushing around this morning.  Pt reports feeling anxious this morning.  Pt reports she can feel when BP is elevated- 'i feel more aware of blood in my body.  I feel tingle-y'.     Review of Systems For ROS see HPI     Objective:   Physical Exam Vitals reviewed.  Constitutional:      General: She is not in acute distress.    Appearance: Normal appearance. She is well-developed. She is obese. She is not ill-appearing.  HENT:     Head: Normocephalic and atraumatic.  Eyes:     Conjunctiva/sclera: Conjunctivae normal.     Pupils: Pupils are equal, round, and reactive to light.  Neck:     Thyroid: No thyromegaly.  Cardiovascular:     Rate and Rhythm: Normal rate and regular rhythm.     Pulses: Normal pulses.     Heart sounds: Normal heart sounds. No murmur heard. Pulmonary:     Effort: Pulmonary effort is normal. No respiratory distress.     Breath sounds: Normal breath sounds.  Abdominal:     General: There is no distension.     Palpations: Abdomen is soft.     Tenderness: There is no abdominal tenderness.  Musculoskeletal:     Cervical back: Normal range of motion and neck supple.     Right lower leg: No edema.     Left lower leg: No edema.  Lymphadenopathy:     Cervical: No cervical adenopathy.  Skin:    General: Skin is warm and dry.   Neurological:     General: No focal deficit present.     Mental Status: She is alert and oriented to person, place, and time.  Psychiatric:        Mood and Affect: Mood normal.        Behavior: Behavior normal.        Thought Content: Thought content normal.           Assessment & Plan:

## 2022-03-22 NOTE — Assessment & Plan Note (Signed)
Pt reports she was very anxious for appt today as she does not weigh herself at home.  She says she felt her BP rise as soon as her name was called.  By end of visit she felt much better and SBP had improved.  DBP was still elevated.  Pt is to monitor at home and let me know if things continue to run high.  Pt expressed understanding and is in agreement w/ plan.

## 2022-03-22 NOTE — Assessment & Plan Note (Signed)
Ongoing issue for pt.  She is down 6 lbs since last visit but likely more than that as pt reports she gained weight between May and August.  She reports that since depression has improved, she has felt much better and has been able to move more and make healthier choices.  Applauded her efforts.  Will follow.

## 2022-03-23 ENCOUNTER — Telehealth: Payer: Self-pay

## 2022-03-23 NOTE — Telephone Encounter (Signed)
-----   Message from Sheliah Hatch, MD sent at 03/23/2022  7:38 AM EST ----- Labs look great!  No changes at this time.  Keep up the good work

## 2022-03-23 NOTE — Telephone Encounter (Signed)
Pt seen results via my chart  

## 2022-04-12 ENCOUNTER — Other Ambulatory Visit: Payer: Self-pay | Admitting: Family Medicine

## 2022-04-12 DIAGNOSIS — F988 Other specified behavioral and emotional disorders with onset usually occurring in childhood and adolescence: Secondary | ICD-10-CM

## 2022-04-12 MED ORDER — METHYLPHENIDATE HCL 10 MG PO TABS: 10.0000 mg | ORAL_TABLET | Freq: Three times a day (TID) | ORAL | 0 refills | Status: DC

## 2022-04-12 NOTE — Telephone Encounter (Signed)
Ritalin 10 mg LOV: 03/22/22 Last Refill:03/15/22 Upcoming appt: none

## 2022-04-13 NOTE — Telephone Encounter (Signed)
Left pt a VM stating Rx has been sent in  

## 2022-05-17 ENCOUNTER — Other Ambulatory Visit: Payer: Self-pay | Admitting: Family Medicine

## 2022-05-17 DIAGNOSIS — F988 Other specified behavioral and emotional disorders with onset usually occurring in childhood and adolescence: Secondary | ICD-10-CM

## 2022-05-17 MED ORDER — METHYLPHENIDATE HCL 10 MG PO TABS: 10.0000 mg | ORAL_TABLET | Freq: Three times a day (TID) | ORAL | 0 refills | Status: DC

## 2022-05-17 NOTE — Telephone Encounter (Signed)
Patient is requesting a refill of the following medications: Requested Prescriptions   Pending Prescriptions Disp Refills   methylphenidate (RITALIN) 10 MG tablet 90 tablet 0    Sig: Take 1 tablet (10 mg total) by mouth 3 (three) times daily.    Date of patient request: 05/17/22 Last office visit: 03/22/22 Date of last refill: 04/12/22 Last refill amount: 90

## 2022-06-12 ENCOUNTER — Other Ambulatory Visit: Payer: Self-pay | Admitting: Family Medicine

## 2022-06-16 ENCOUNTER — Other Ambulatory Visit: Payer: Self-pay | Admitting: Family Medicine

## 2022-06-16 DIAGNOSIS — F988 Other specified behavioral and emotional disorders with onset usually occurring in childhood and adolescence: Secondary | ICD-10-CM

## 2022-06-16 MED ORDER — METHYLPHENIDATE HCL 10 MG PO TABS: 10.0000 mg | ORAL_TABLET | Freq: Three times a day (TID) | ORAL | 0 refills | Status: DC

## 2022-06-16 NOTE — Telephone Encounter (Signed)
Ritalin 10 mg LOV: 03/22/22 Last Refill:05/17/22 Upcoming appt: none

## 2022-06-16 NOTE — Telephone Encounter (Signed)
Informed pt that Rx has been sent in  

## 2022-07-13 ENCOUNTER — Other Ambulatory Visit: Payer: Self-pay | Admitting: Family Medicine

## 2022-07-13 DIAGNOSIS — F988 Other specified behavioral and emotional disorders with onset usually occurring in childhood and adolescence: Secondary | ICD-10-CM

## 2022-07-13 MED ORDER — METHYLPHENIDATE HCL 10 MG PO TABS: 10.0000 mg | ORAL_TABLET | Freq: Three times a day (TID) | ORAL | 0 refills | Status: DC

## 2022-07-13 NOTE — Telephone Encounter (Signed)
Ritalin 10 mg LOV: 03/22/22 //Last Refill:221/24 Upcoming appt: none

## 2022-07-13 NOTE — Telephone Encounter (Signed)
Informed pt that Rx has been sent in  

## 2022-07-19 NOTE — Telephone Encounter (Signed)
Encourage patient to contact the pharmacy for refills or they can request refills through [place]:  [facility]  [address], [place],  [address]  MEDICATION NAME & DOSE: methylphenidate (RITALIN) 10 MG tablet  NOTES/COMMENTS FROM PATIENT: It's available now there please fill it.     [place] office please notify patient: It takes 48-72 hours to process rx refill requests Ask patient to call pharmacy to ensure rx is ready before heading there.

## 2022-07-20 ENCOUNTER — Other Ambulatory Visit: Payer: Self-pay | Admitting: Family Medicine

## 2022-07-20 DIAGNOSIS — F988 Other specified behavioral and emotional disorders with onset usually occurring in childhood and adolescence: Secondary | ICD-10-CM

## 2022-07-20 MED ORDER — METHYLPHENIDATE HCL 10 MG PO TABS: 10.0000 mg | ORAL_TABLET | Freq: Three times a day (TID) | ORAL | 0 refills | Status: DC

## 2022-07-20 NOTE — Telephone Encounter (Signed)
Pt called back and would like prescription switched to  [facility] [phone]

## 2022-07-20 NOTE — Telephone Encounter (Signed)
Could you resend this to [facility] on [address] please ? [facility] [address] [facility] .

## 2022-07-20 NOTE — Telephone Encounter (Signed)
Can you please refuse since you sent this in it will not allow me to do so

## 2022-07-20 NOTE — Telephone Encounter (Signed)
Patient called back to check on the status on her medication refill.

## 2022-07-20 NOTE — Addendum Note (Signed)
Addended byPauline Good, Juno Alers on: 07/20/2022 11:45 AM   Modules accepted: Orders

## 2022-08-19 ENCOUNTER — Other Ambulatory Visit: Payer: Self-pay | Admitting: Family Medicine

## 2022-08-19 DIAGNOSIS — F988 Other specified behavioral and emotional disorders with onset usually occurring in childhood and adolescence: Secondary | ICD-10-CM

## 2022-08-19 MED ORDER — METHYLPHENIDATE HCL 10 MG PO TABS: 10.0000 mg | ORAL_TABLET | Freq: Three times a day (TID) | ORAL | 0 refills | Status: DC

## 2022-08-19 NOTE — Telephone Encounter (Signed)
Ritalin 10 mg LOV: 03/22/22 Last Refill:07/20/22 Upcoming appt: none   [facility] [address]

## 2022-08-19 NOTE — Telephone Encounter (Signed)
Pt aware Rx has been sent in.  

## 2022-09-15 ENCOUNTER — Other Ambulatory Visit: Payer: Self-pay | Admitting: Family Medicine

## 2022-09-16 ENCOUNTER — Other Ambulatory Visit: Payer: Self-pay | Admitting: Family Medicine

## 2022-09-16 DIAGNOSIS — F988 Other specified behavioral and emotional disorders with onset usually occurring in childhood and adolescence: Secondary | ICD-10-CM

## 2022-09-16 MED ORDER — METHYLPHENIDATE HCL 10 MG PO TABS: 10.0000 mg | ORAL_TABLET | Freq: Three times a day (TID) | ORAL | 0 refills | Status: DC

## 2022-09-16 NOTE — Telephone Encounter (Signed)
Left vm stating rx has been sent in  

## 2022-09-16 NOTE — Telephone Encounter (Signed)
Ritalin 10 mg LOV: 03/23/23 Last Refill:08/19/22 Upcoming appt: none

## 2022-10-17 ENCOUNTER — Other Ambulatory Visit: Payer: Self-pay | Admitting: Family Medicine

## 2022-10-17 DIAGNOSIS — F988 Other specified behavioral and emotional disorders with onset usually occurring in childhood and adolescence: Secondary | ICD-10-CM

## 2022-10-18 MED ORDER — METHYLPHENIDATE HCL 10 MG PO TABS: 10.0000 mg | ORAL_TABLET | Freq: Three times a day (TID) | ORAL | 0 refills | Status: DC

## 2022-10-18 NOTE — Telephone Encounter (Signed)
Pt aware of refill.

## 2022-10-18 NOTE — Telephone Encounter (Signed)
Ritilan  LOV: 03/22/22 Last Refill:09/16/22 Upcoming appt: none

## 2022-11-05 ENCOUNTER — Encounter: Payer: Self-pay | Admitting: Family Medicine

## 2022-11-05 ENCOUNTER — Ambulatory Visit (INDEPENDENT_AMBULATORY_CARE_PROVIDER_SITE_OTHER): Payer: BC Managed Care – PPO | Admitting: Family Medicine

## 2022-11-05 VITALS — BP 128/82 | HR 107 | Temp 98.8°F | Resp 20 | Ht 67.0 in | Wt 309.1 lb

## 2022-11-05 DIAGNOSIS — F988 Other specified behavioral and emotional disorders with onset usually occurring in childhood and adolescence: Secondary | ICD-10-CM | POA: Diagnosis not present

## 2022-11-05 DIAGNOSIS — Z0000 Encounter for general adult medical examination without abnormal findings: Secondary | ICD-10-CM

## 2022-11-05 LAB — CBC WITH DIFFERENTIAL/PLATELET
Absolute Monocytes: 573 cells/uL (ref 200–950)
Basophils Absolute: 50 cells/uL (ref 0–200)
Basophils Relative: 0.6 %
Lymphs Abs: 1892 cells/uL (ref 850–3900)
MCH: 30.1 pg (ref 27.0–33.0)
MCHC: 32.6 g/dL (ref 32.0–36.0)
MCV: 92.2 fL (ref 80.0–100.0)
MPV: 11.6 fL (ref 7.5–12.5)
Monocytes Relative: 6.9 %
Neutrophils Relative %: 67.9 %
Platelets: 220 10*3/uL (ref 140–400)
Total Lymphocyte: 22.8 %
WBC: 8.3 10*3/uL (ref 3.8–10.8)

## 2022-11-05 MED ORDER — METHYLPHENIDATE HCL 10 MG PO TABS: 10.0000 mg | ORAL_TABLET | Freq: Three times a day (TID) | ORAL | 0 refills | Status: DC

## 2022-11-05 MED ORDER — NORETHINDRONE ACET-ETHINYL EST 1-20 MG-MCG PO TABS: 1.0000 | ORAL_TABLET | Freq: Every day | ORAL | 0 refills | Status: DC

## 2022-11-05 MED ORDER — FLUOXETINE HCL 20 MG PO CAPS: 20.0000 mg | ORAL_CAPSULE | Freq: Every day | ORAL | 2 refills | Status: DC

## 2022-11-05 NOTE — Assessment & Plan Note (Signed)
Pt's PE WNL w/ exception of BMI.  UTD on pap, Tdap.  Check labs.  Anticipatory guidance provided.  ?

## 2022-11-05 NOTE — Progress Notes (Signed)
   Subjective:    Patient ID: Crystal Clements, female    DOB: 1990/06/15, 32 y.o.   MRN: [id number]  HPI CPE- UTD on pap, Tdap  Patient Care Team    Relationship Specialty Notifications Start End  Sheliah Hatch, MD PCP - General   04/08/10     Health Maintenance  Topic Date Due   INFLUENZA VACCINE  11/25/2022   PAP SMEAR-Modifier  08/17/2024   DTaP/Tdap/Td (9 - Td or Tdap) 02/26/2031   HPV VACCINES  Completed   HIV Screening  Completed   COVID-19 Vaccine  Discontinued   Hepatitis C Screening  Discontinued      Review of Systems Patient reports no vision/ hearing changes, adenopathy,fever, weight change,  persistant/recurrent hoarseness , swallowing issues, chest pain, palpitations, edema, persistant/recurrent cough, hemoptysis, dyspnea (rest/exertional/paroxysmal nocturnal), gastrointestinal bleeding (melena, rectal bleeding), abdominal pain, significant heartburn, bowel changes, GU symptoms (dysuria, hematuria, incontinence), Gyn symptoms (abnormal  bleeding, pain),  syncope, focal weakness, memory loss, numbness & tingling, skin/hair/nail changes, abnormal bruising or bleeding, anxiety, or depression.     Objective:   Physical Exam General Appearance:    Alert, cooperative, no distress, appears stated age  Head:    Normocephalic, without obvious abnormality, atraumatic  Eyes:    PERRL, conjunctiva/corneas clear, EOM's intact both eyes  Ears:    Normal TM's and external ear canals, both ears  Nose:   Nares normal, septum midline, mucosa normal, no drainage    or sinus tenderness  Throat:   Lips, mucosa, and tongue normal; teeth and gums normal  Neck:   Supple, symmetrical, trachea midline, no adenopathy;    Thyroid: no enlargement/tenderness/nodules  Back:     Symmetric, no curvature, ROM normal, no CVA tenderness  Lungs:     Clear to auscultation bilaterally, respirations unlabored  Chest Wall:    No tenderness or deformity   Heart:    Regular rate and rhythm, S1 and S2  normal, no murmur, rub   or gallop  Breast Exam:    Deferred to GYN  Abdomen:     Soft, non-tender, bowel sounds active all four quadrants,    no masses, no organomegaly  Genitalia:    Deferred to GYN  Rectal:    Extremities:   Extremities normal, atraumatic, no cyanosis or edema  Pulses:   2+ and symmetric all extremities  Skin:   Skin color, texture, turgor normal, no rashes or lesions  Lymph nodes:   Cervical, supraclavicular, and axillary nodes normal  Neurologic:   CNII-XII intact, normal strength, sensation and reflexes    throughout          Assessment & Plan:

## 2022-11-05 NOTE — Patient Instructions (Signed)
Follow up in 1 year or as needed We'll notify you of your lab results and make any changes if needed Keep up the good work on healthy diet and regular exercise- you're doing great! Call with any questions or concerns Stay Safe!  Stay Healthy! Have a great summer!!! 

## 2022-11-05 NOTE — Assessment & Plan Note (Signed)
Ongoing issue for pt.  Weight and BMI are stable.  Encouraged healthy diet and regular exercise.  Check labs to risk stratify.  Will follow. 

## 2022-11-06 LAB — CBC WITH DIFFERENTIAL/PLATELET
Eosinophils Absolute: 149 cells/uL (ref 15–500)
Eosinophils Relative: 1.8 %
HCT: 43.5 % (ref 35.0–45.0)
Hemoglobin: 14.2 g/dL (ref 11.7–15.5)
Neutro Abs: 5636 cells/uL (ref 1500–7800)
RBC: 4.72 10*6/uL (ref 3.80–5.10)
RDW: 13.6 % (ref 11.0–15.0)

## 2022-11-06 LAB — BASIC METABOLIC PANEL
BUN: 8 mg/dL (ref 7–25)
CO2: 27 mmol/L (ref 20–32)
Calcium: 9 mg/dL (ref 8.6–10.2)
Chloride: 104 mmol/L (ref 98–110)
Creat: 0.74 mg/dL (ref 0.50–0.97)
Glucose, Bld: 94 mg/dL (ref 65–99)
Potassium: 4.3 mmol/L (ref 3.5–5.3)
Sodium: 141 mmol/L (ref 135–146)

## 2022-11-06 LAB — LIPID PANEL
Cholesterol: 201 mg/dL — ABNORMAL HIGH (ref <200)
HDL: 52 mg/dL (ref >=50)
LDL Cholesterol (Calc): 119 mg/dL (calc) — ABNORMAL HIGH
Non-HDL Cholesterol (Calc): 149 mg/dL (calc) — ABNORMAL HIGH (ref <130)
Total CHOL/HDL Ratio: 3.9 (calc) (ref <5.0)
Triglycerides: 178 mg/dL — ABNORMAL HIGH (ref <150)

## 2022-11-06 LAB — HEPATIC FUNCTION PANEL
AG Ratio: 1.6 (calc) (ref 1.0–2.5)
ALT: 23 U/L (ref 6–29)
AST: 23 U/L (ref 10–30)
Albumin: 3.9 g/dL (ref 3.6–5.1)
Alkaline phosphatase (APISO): 82 U/L (ref 31–125)
Bilirubin, Direct: 0.1 mg/dL (ref 0.0–0.2)
Globulin: 2.5 g/dL (calc) (ref 1.9–3.7)
Indirect Bilirubin: 0.4 mg/dL (calc) (ref 0.2–1.2)
Total Bilirubin: 0.5 mg/dL (ref 0.2–1.2)
Total Protein: 6.4 g/dL (ref 6.1–8.1)

## 2022-11-06 LAB — TSH: TSH: 2.52 mIU/L

## 2022-11-06 LAB — VITAMIN D 25 HYDROXY (VIT D DEFICIENCY, FRACTURES): Vit D, 25-Hydroxy: 33 ng/mL (ref 30–100)

## 2022-11-08 ENCOUNTER — Telehealth: Payer: Self-pay

## 2022-11-08 NOTE — Telephone Encounter (Signed)
Left results on pt VM  

## 2022-11-08 NOTE — Telephone Encounter (Signed)
-----   Message from Neena Rhymes sent at 11/08/2022  7:25 AM EDT ----- Labs look good!  No changes at this time

## 2022-12-14 ENCOUNTER — Other Ambulatory Visit: Payer: Self-pay | Admitting: Family Medicine

## 2022-12-14 DIAGNOSIS — F988 Other specified behavioral and emotional disorders with onset usually occurring in childhood and adolescence: Secondary | ICD-10-CM

## 2022-12-14 MED ORDER — METHYLPHENIDATE HCL 10 MG PO TABS
10.0000 mg | ORAL_TABLET | Freq: Three times a day (TID) | ORAL | 0 refills | Status: DC
Start: 2022-12-14 — End: 2023-01-12

## 2022-12-14 NOTE — Telephone Encounter (Signed)
Ritalin 10 mg Requested Prescriptions   Pending Prescriptions Disp Refills   methylphenidate (RITALIN) 10 MG tablet 90 tablet 0    Sig: Take 1 tablet (10 mg total) by mouth 3 (three) times daily.     Date of patient request: 12/14/22 Last office visit: 11/05/22 Date of last refill: 11/05/22 Last refill amount: 90 Follow up time period per chart: 1 year

## 2023-01-12 ENCOUNTER — Other Ambulatory Visit: Payer: Self-pay | Admitting: Family Medicine

## 2023-01-12 DIAGNOSIS — F988 Other specified behavioral and emotional disorders with onset usually occurring in childhood and adolescence: Secondary | ICD-10-CM

## 2023-01-12 MED ORDER — METHYLPHENIDATE HCL 10 MG PO TABS
10.0000 mg | ORAL_TABLET | Freq: Three times a day (TID) | ORAL | 0 refills | Status: DC
Start: 2023-01-12 — End: 2023-02-10

## 2023-01-12 NOTE — Telephone Encounter (Signed)
Refill request methylphenidate (RITALIN) 10 MG tablet  Last office visit 11/05/2022

## 2023-01-13 NOTE — Telephone Encounter (Signed)
Left vm to make aware

## 2023-02-01 ENCOUNTER — Other Ambulatory Visit: Payer: Self-pay | Admitting: Family Medicine

## 2023-02-10 ENCOUNTER — Other Ambulatory Visit: Payer: Self-pay | Admitting: Family Medicine

## 2023-02-10 DIAGNOSIS — F988 Other specified behavioral and emotional disorders with onset usually occurring in childhood and adolescence: Secondary | ICD-10-CM

## 2023-02-10 DIAGNOSIS — F900 Attention-deficit hyperactivity disorder, predominantly inattentive type: Secondary | ICD-10-CM

## 2023-02-10 MED ORDER — METHYLPHENIDATE HCL 10 MG PO TABS
10.0000 mg | ORAL_TABLET | Freq: Three times a day (TID) | ORAL | 0 refills | Status: DC
Start: 2023-02-10 — End: 2023-03-14

## 2023-02-10 NOTE — Telephone Encounter (Signed)
Patient is requesting a refill of the following medications: Requested Prescriptions   Pending Prescriptions Disp Refills   methylphenidate (RITALIN) 10 MG tablet 90 tablet 0    Sig: Take 1 tablet (10 mg total) by mouth 3 (three) times daily.    Date of patient request: 02/10/23 Last office visit: 11/05/22 Date of last refill: 01/12/23 Last refill amount: 90 Follow up time period per chart: 1year

## 2023-03-05 ENCOUNTER — Other Ambulatory Visit: Payer: Self-pay | Admitting: Family Medicine

## 2023-03-14 ENCOUNTER — Other Ambulatory Visit: Payer: Self-pay | Admitting: Family Medicine

## 2023-03-14 DIAGNOSIS — F900 Attention-deficit hyperactivity disorder, predominantly inattentive type: Secondary | ICD-10-CM

## 2023-03-14 MED ORDER — METHYLPHENIDATE HCL 10 MG PO TABS
10.0000 mg | ORAL_TABLET | Freq: Three times a day (TID) | ORAL | 0 refills | Status: DC
Start: 2023-03-14 — End: 2023-04-14

## 2023-03-14 NOTE — Telephone Encounter (Signed)
Last refilled 02/10/2023 Last office visit 11/05/2022

## 2023-04-14 ENCOUNTER — Other Ambulatory Visit: Payer: Self-pay | Admitting: Family Medicine

## 2023-04-14 DIAGNOSIS — F900 Attention-deficit hyperactivity disorder, predominantly inattentive type: Secondary | ICD-10-CM

## 2023-04-15 MED ORDER — METHYLPHENIDATE HCL 10 MG PO TABS: 10.0000 mg | ORAL_TABLET | Freq: Three times a day (TID) | ORAL | 0 refills | Status: DC

## 2023-05-17 ENCOUNTER — Other Ambulatory Visit: Payer: Self-pay | Admitting: Family Medicine

## 2023-05-17 DIAGNOSIS — F900 Attention-deficit hyperactivity disorder, predominantly inattentive type: Secondary | ICD-10-CM

## 2023-05-18 MED ORDER — METHYLPHENIDATE HCL 10 MG PO TABS: 10.0000 mg | ORAL_TABLET | Freq: Three times a day (TID) | ORAL | 0 refills | Status: DC

## 2023-05-31 ENCOUNTER — Other Ambulatory Visit: Payer: Self-pay | Admitting: Family Medicine

## 2023-06-20 ENCOUNTER — Other Ambulatory Visit: Payer: Self-pay | Admitting: Family Medicine

## 2023-06-20 DIAGNOSIS — F900 Attention-deficit hyperactivity disorder, predominantly inattentive type: Secondary | ICD-10-CM

## 2023-06-20 MED ORDER — METHYLPHENIDATE HCL 10 MG PO TABS: 10.0000 mg | ORAL_TABLET | Freq: Three times a day (TID) | ORAL | 0 refills | Status: DC

## 2023-07-18 ENCOUNTER — Other Ambulatory Visit: Payer: Self-pay | Admitting: Family Medicine

## 2023-07-18 DIAGNOSIS — F900 Attention-deficit hyperactivity disorder, predominantly inattentive type: Secondary | ICD-10-CM

## 2023-07-18 MED ORDER — METHYLPHENIDATE HCL 10 MG PO TABS: 10.0000 mg | ORAL_TABLET | Freq: Three times a day (TID) | ORAL | 0 refills | Status: DC

## 2023-07-18 NOTE — Telephone Encounter (Signed)
 Controlled substance database (PDMP) reviewed. No concerns appreciated.  Methylphenidate 10 mg #90 last filled on 06/20/2023, previously 05/18/2023 and consistent refills previously with Dr. Beverely Low.  She did have an office visit in July of last year with 1 year follow-up planned.  Temporary refill ordered by me, as PCP outside of office at this time.

## 2023-08-16 ENCOUNTER — Other Ambulatory Visit: Payer: Self-pay | Admitting: Family Medicine

## 2023-08-16 DIAGNOSIS — F900 Attention-deficit hyperactivity disorder, predominantly inattentive type: Secondary | ICD-10-CM

## 2023-08-17 MED ORDER — METHYLPHENIDATE HCL 10 MG PO TABS: 10.0000 mg | ORAL_TABLET | Freq: Three times a day (TID) | ORAL | 0 refills | Status: DC

## 2023-08-23 ENCOUNTER — Other Ambulatory Visit: Payer: Self-pay | Admitting: Family Medicine

## 2023-09-16 ENCOUNTER — Other Ambulatory Visit: Payer: Self-pay | Admitting: Family Medicine

## 2023-09-16 DIAGNOSIS — F900 Attention-deficit hyperactivity disorder, predominantly inattentive type: Secondary | ICD-10-CM

## 2023-09-16 MED ORDER — METHYLPHENIDATE HCL 10 MG PO TABS: 10.0000 mg | ORAL_TABLET | Freq: Three times a day (TID) | ORAL | 0 refills | Status: DC

## 2023-09-16 NOTE — Telephone Encounter (Signed)
 Medication: Ritalin  Directions: Take 1 tablet (10 mg total) by mouth 3 (three) times daily.  Last given: 08/17/2023 Number refills: 0 Last o/v: 11/05/2022 Follow up: Follow up in 1 year or as needed  Labs: 11/05/2022

## 2023-09-16 NOTE — Telephone Encounter (Signed)
 Copied from CRM [id number]. Topic: Clinical - Medication Refill >> Sep 16, 2023 11:18 AM Bambi Bonine D wrote: Medication: methylphenidate  (RITALIN ) 10 MG tablet  Has the patient contacted their pharmacy? Yes (Agent: If no, request that the patient contact the pharmacy for the refill. If patient does not wish to contact the pharmacy document the reason why and proceed with request.) (Agent: If yes, when and what did the pharmacy advise?)  This is the patient's preferred pharmacy:  [id number] - [address] [place] [address] Phone: [phone] Fax: [phone]  Is this the correct pharmacy for this prescription? Yes If no, delete pharmacy and type the correct one.   Has the prescription been filled recently? No  Is the patient out of the medication? No  Has the patient been seen for an appointment in the last year OR does the patient have an upcoming appointment? Yes  Can we respond through MyChart? Yes  Agent: Please be advised that Rx refills may take up to 3 business days. We ask that you follow-up with your pharmacy.

## 2023-11-22 ENCOUNTER — Other Ambulatory Visit: Payer: Self-pay | Admitting: Family Medicine

## 2023-11-22 DIAGNOSIS — F900 Attention-deficit hyperactivity disorder, predominantly inattentive type: Secondary | ICD-10-CM

## 2023-11-22 NOTE — Telephone Encounter (Unsigned)
 Copied from CRM [id number]. Topic: Clinical - Medication Refill >> Nov 22, 2023  2:40 PM Drema MATSU wrote: Medication: methylphenidate  (RITALIN ) 10 MG tablet  Has the patient contacted their pharmacy? Yes (Agent: If no, request that the patient contact the pharmacy for the refill. If patient does not wish to contact the pharmacy document the reason why and proceed with request.) advised to call provider  (Agent: If yes, when and what did the pharmacy advise?)  This is the patient's preferred pharmacy:  [id number] - [address] [place] [address] Phone: [phone] Fax: [phone]  Is this the correct pharmacy for this prescription? Yes If no, delete pharmacy and type the correct one.   Has the prescription been filled recently? No  Is the patient out of the medication? No  Has the patient been seen for an appointment in the last year OR does the patient have an upcoming appointment? Yes  Can we respond through MyChart? Yes  Agent: Please be advised that Rx refills may take up to 3 business days. We ask that you follow-up with your pharmacy.

## 2023-11-22 NOTE — Telephone Encounter (Signed)
 Patient is overdue for an appointment and must get scheduled prior to refill being considered

## 2023-11-23 ENCOUNTER — Other Ambulatory Visit: Payer: Self-pay | Admitting: Family Medicine

## 2023-11-23 ENCOUNTER — Other Ambulatory Visit: Payer: Self-pay | Admitting: Family

## 2023-11-23 DIAGNOSIS — F900 Attention-deficit hyperactivity disorder, predominantly inattentive type: Secondary | ICD-10-CM

## 2023-11-23 MED ORDER — METHYLPHENIDATE HCL 10 MG PO TABS: 10.0000 mg | ORAL_TABLET | Freq: Three times a day (TID) | ORAL | 0 refills | Status: DC

## 2023-11-23 NOTE — Telephone Encounter (Signed)
 Called patient and left vm to return call. Patient needs to schedule appointment before refills are sent.

## 2023-11-23 NOTE — Telephone Encounter (Signed)
 Requested Prescriptions   Pending Prescriptions Disp Refills   methylphenidate  (RITALIN ) 10 MG tablet 90 tablet 0    Sig: Take 1 tablet (10 mg total) by mouth 3 (three) times daily.     Date of patient request: 11/23/23 Last office visit: Visit date not found Upcoming visit: 11/23/2023 Date of last refill: 09/16/23 Last refill amount: 3o days    Copied from CRM #[id number]. Topic: Clinical - Medication Question >> Nov 23, 2023 11:04 AM Deaijah H wrote: Reason for CRM: Patient would like to know since she now has an appointment scheduled will her scripts get filled. Please call [phone] to confirm

## 2023-11-24 NOTE — Telephone Encounter (Signed)
 I am sending again because it will not let me refuse it because its controlled. Can you do it? Thank you!

## 2023-12-23 ENCOUNTER — Ambulatory Visit (INDEPENDENT_AMBULATORY_CARE_PROVIDER_SITE_OTHER): Admitting: Family Medicine

## 2023-12-23 DIAGNOSIS — F900 Attention-deficit hyperactivity disorder, predominantly inattentive type: Secondary | ICD-10-CM | POA: Diagnosis not present

## 2023-12-23 DIAGNOSIS — N926 Irregular menstruation, unspecified: Secondary | ICD-10-CM | POA: Diagnosis not present

## 2023-12-23 LAB — HEPATIC FUNCTION PANEL
ALT: 23 U/L (ref 0–35)
AST: 31 U/L (ref 0–37)
Albumin: 4 g/dL (ref 3.5–5.2)
Alkaline Phosphatase: 76 U/L (ref 39–117)
Bilirubin, Direct: 0.1 mg/dL (ref 0.0–0.3)
Total Bilirubin: 0.6 mg/dL (ref 0.2–1.2)
Total Protein: 7.1 g/dL (ref 6.0–8.3)

## 2023-12-23 LAB — CBC WITH DIFFERENTIAL/PLATELET
Basophils Absolute: 0 K/uL (ref 0.0–0.1)
Basophils Relative: 0.3 % (ref 0.0–3.0)
Eosinophils Absolute: 0.2 K/uL (ref 0.0–0.7)
Eosinophils Relative: 1.6 % (ref 0.0–5.0)
HCT: 42.7 % (ref 36.0–46.0)
Hemoglobin: 13.8 g/dL (ref 12.0–15.0)
Lymphocytes Relative: 20.6 % (ref 12.0–46.0)
Lymphs Abs: 2.3 K/uL (ref 0.7–4.0)
MCHC: 32.4 g/dL (ref 30.0–36.0)
MCV: 96.2 fl (ref 78.0–100.0)
Monocytes Absolute: 0.5 K/uL (ref 0.1–1.0)
Monocytes Relative: 4.5 % (ref 3.0–12.0)
Neutro Abs: 8.2 K/uL — ABNORMAL HIGH (ref 1.4–7.7)
Neutrophils Relative %: 73 % (ref 43.0–77.0)
Platelets: 258 K/uL (ref 150.0–400.0)
RBC: 4.44 Mil/uL (ref 3.87–5.11)
RDW: 13.4 % (ref 11.5–15.5)
WBC: 11.3 K/uL — ABNORMAL HIGH (ref 4.0–10.5)

## 2023-12-23 LAB — TSH: TSH: 2.94 u[IU]/mL (ref 0.35–5.50)

## 2023-12-23 LAB — BASIC METABOLIC PANEL WITH GFR
BUN: 7 mg/dL (ref 6–23)
CO2: 27 meq/L (ref 19–32)
Calcium: 9.1 mg/dL (ref 8.4–10.5)
Chloride: 102 meq/L (ref 96–112)
Creatinine, Ser: 0.69 mg/dL (ref 0.40–1.20)
GFR: 112.9 mL/min (ref >60.00)
Glucose, Bld: 94 mg/dL (ref 70–99)
Potassium: 3.8 meq/L (ref 3.5–5.1)
Sodium: 141 meq/L (ref 135–145)

## 2023-12-23 LAB — LIPID PANEL
Cholesterol: 214 mg/dL — ABNORMAL HIGH (ref 0–200)
HDL: 50.1 mg/dL (ref >39.00)
LDL Cholesterol: 115 mg/dL — ABNORMAL HIGH (ref 0–99)
NonHDL: 164.2
Total CHOL/HDL Ratio: 4
Triglycerides: 244 mg/dL — ABNORMAL HIGH (ref 0.0–149.0)
VLDL: 48.8 mg/dL — ABNORMAL HIGH (ref 0.0–40.0)

## 2023-12-23 MED ORDER — METHYLPHENIDATE HCL 10 MG PO TABS: 10.0000 mg | ORAL_TABLET | Freq: Three times a day (TID) | ORAL | 0 refills | Status: DC

## 2023-12-23 NOTE — Assessment & Plan Note (Signed)
 Improving!  She is down 27 lbs in the past year.  BMI now 44.23  She is now exercising regularly and has changed her eating habits.  Cut out liquor.  Applauded her efforts.  Check labs to risk stratify.  Will follow.

## 2023-12-23 NOTE — Assessment & Plan Note (Signed)
 Ongoing issue.  Currently well controlled on Ritalin  10mg  TID prn.  No med changes at this time.

## 2023-12-23 NOTE — Patient Instructions (Addendum)
 Schedule your complete physical in 6 months We'll notify you of your lab results and make any changes if needed Keep up the good work on healthy diet and regular exercise- I'm SO proud of you!! Call with any questions or concerns Stay Safe!  Stay Healthy! Happy Labor Day!!!

## 2023-12-23 NOTE — Assessment & Plan Note (Signed)
 New.  Pt reports that since stopping OCPs last December she has only had 3 periods.  She has had sxs of PMS but no menstruation.  Will check labs to assess hormones.  Pt expressed understanding and is in agreement w/ plan.

## 2023-12-23 NOTE — Progress Notes (Signed)
   Subjective:    Patient ID: Crystal Clements, female    DOB: 1990/11/21, 33 y.o.   MRN: [id number]  HPI ADHD- chronic problem, currently on Ritalin  10mg  TID.  Taking it as needed.  Reports sxs are well controlled.  Obesity- down 27 lbs since last year.  BMI 44.23  Pt reports doing this through 'life changes'.  Is exercising regularly, changed eating habits.  Has cut out liquor.  Pt reports feeling 'so good'.  Menstrual irregularity- pt reports periods are very irregular.  Having sxs of PMS but then no associated menstruation.  Was taking OCPs until December- stopped due to mood swings.  Has only had 3 cycles since stopping OCPs.     Review of Systems For ROS see HPI     Objective:   Physical Exam Vitals reviewed.  Constitutional:      General: She is not in acute distress.    Appearance: Normal appearance. She is well-developed. She is obese. She is not ill-appearing.  HENT:     Head: Normocephalic and atraumatic.  Eyes:     Conjunctiva/sclera: Conjunctivae normal.     Pupils: Pupils are equal, round, and reactive to light.  Neck:     Thyroid : No thyromegaly.  Cardiovascular:     Rate and Rhythm: Normal rate and regular rhythm.     Pulses: Normal pulses.     Heart sounds: Normal heart sounds. No murmur heard. Pulmonary:     Effort: Pulmonary effort is normal. No respiratory distress.     Breath sounds: Normal breath sounds.  Abdominal:     General: There is no distension.     Palpations: Abdomen is soft.     Tenderness: There is no abdominal tenderness.  Musculoskeletal:     Cervical back: Normal range of motion and neck supple.     Right lower leg: No edema.     Left lower leg: No edema.  Lymphadenopathy:     Cervical: No cervical adenopathy.  Skin:    General: Skin is warm and dry.  Neurological:     General: No focal deficit present.     Mental Status: She is alert and oriented to person, place, and time.  Psychiatric:        Mood and Affect: Mood normal.         Behavior: Behavior normal.        Thought Content: Thought content normal.           Assessment & Plan:

## 2023-12-24 LAB — FSH/LH
FSH: 6.7 m[IU]/mL
LH: 11.5 m[IU]/mL

## 2023-12-24 LAB — ESTRADIOL: Estradiol: 72 pg/mL

## 2023-12-24 LAB — PROLACTIN: Prolactin: 6.3 ng/mL

## 2023-12-27 ENCOUNTER — Ambulatory Visit: Payer: Self-pay | Admitting: Family Medicine

## 2024-01-23 ENCOUNTER — Encounter: Payer: Self-pay | Admitting: Family Medicine

## 2024-01-23 DIAGNOSIS — F900 Attention-deficit hyperactivity disorder, predominantly inattentive type: Secondary | ICD-10-CM

## 2024-01-23 MED ORDER — METHYLPHENIDATE HCL 10 MG PO TABS
10.0000 mg | ORAL_TABLET | Freq: Three times a day (TID) | ORAL | 0 refills | Status: DC
Start: 2024-01-23 — End: 2024-02-20

## 2024-01-23 NOTE — Telephone Encounter (Signed)
 Refill request received with PCP out of office.  Controlled substance database reviewed, #90 last filled on 12/23/2023, previously 11/23/2023, 09/16/2023, consistent refills previously.  No concerns.  Recent visit on August 29 noted.  Refill granted.

## 2024-02-15 ENCOUNTER — Other Ambulatory Visit: Payer: Self-pay | Admitting: Family Medicine

## 2024-02-20 ENCOUNTER — Encounter: Payer: Self-pay | Admitting: Family Medicine

## 2024-02-20 DIAGNOSIS — F900 Attention-deficit hyperactivity disorder, predominantly inattentive type: Secondary | ICD-10-CM

## 2024-02-20 MED ORDER — METHYLPHENIDATE HCL 10 MG PO TABS: 10.0000 mg | ORAL_TABLET | Freq: Three times a day (TID) | ORAL | 0 refills | Status: DC

## 2024-03-20 ENCOUNTER — Encounter: Payer: Self-pay | Admitting: Family Medicine

## 2024-03-20 DIAGNOSIS — F900 Attention-deficit hyperactivity disorder, predominantly inattentive type: Secondary | ICD-10-CM

## 2024-03-20 MED ORDER — METHYLPHENIDATE HCL 10 MG PO TABS: 10.0000 mg | ORAL_TABLET | Freq: Three times a day (TID) | ORAL | 0 refills | Status: DC

## 2024-03-20 NOTE — Addendum Note (Signed)
 Addended by: Tenisha Fleece E on: 03/20/2024 11:18 AM   Modules accepted: Orders

## 2024-04-17 DIAGNOSIS — F900 Attention-deficit hyperactivity disorder, predominantly inattentive type: Secondary | ICD-10-CM

## 2024-04-17 MED ORDER — METHYLPHENIDATE HCL 10 MG PO TABS: 10.0000 mg | ORAL_TABLET | Freq: Three times a day (TID) | ORAL | 0 refills | Status: DC

## 2024-04-17 NOTE — Telephone Encounter (Signed)
 Requested Prescriptions   Pending Prescriptions Disp Refills   methylphenidate  (RITALIN ) 10 MG tablet 90 tablet 0    Sig: Take 1 tablet (10 mg total) by mouth 3 (three) times daily.     Date of patient request: 04/17/24 Last office visit: 12/23/2023 Upcoming visit: 06/22/2024 Date of last refill: 03/20/24 Last refill amount: 90

## 2024-05-13 ENCOUNTER — Other Ambulatory Visit: Payer: Self-pay | Admitting: Family Medicine

## 2024-05-16 ENCOUNTER — Encounter: Payer: Self-pay | Admitting: Family Medicine

## 2024-05-16 DIAGNOSIS — F900 Attention-deficit hyperactivity disorder, predominantly inattentive type: Secondary | ICD-10-CM

## 2024-05-16 MED ORDER — METHYLPHENIDATE HCL 10 MG PO TABS: 10.0000 mg | ORAL_TABLET | Freq: Three times a day (TID) | ORAL | 0 refills | Status: AC

## 2024-05-16 NOTE — Telephone Encounter (Signed)
 Requested Prescriptions   Pending Prescriptions Disp Refills   methylphenidate  (RITALIN ) 10 MG tablet 90 tablet 0    Sig: Take 1 tablet (10 mg total) by mouth 3 (three) times daily.     Date of patient request: 05/16/24 Last office visit: 12/23/2023 Upcoming visit: 06/22/2024 Date of last refill: 04/17/24 Last refill amount: 90

## 2024-06-22 ENCOUNTER — Encounter: Admitting: Family Medicine
